# Patient Record
Sex: Male | Born: 1985 | Race: Black or African American | Hispanic: No | Marital: Single | Smoking: Former smoker
Health system: Southern US, Community
[De-identification: ages and names within clinical notes are randomized; demographics above are authoritative.]

## PROBLEM LIST (undated history)

## (undated) DIAGNOSIS — F209 Schizophrenia, unspecified: Secondary | ICD-10-CM

## (undated) DIAGNOSIS — F329 Major depressive disorder, single episode, unspecified: Secondary | ICD-10-CM

## (undated) DIAGNOSIS — F32A Depression, unspecified: Secondary | ICD-10-CM

## (undated) DIAGNOSIS — T1491XA Suicide attempt, initial encounter: Secondary | ICD-10-CM

---

## 2015-11-29 ENCOUNTER — Emergency Department (HOSPITAL_COMMUNITY)
Admission: EM | Admit: 2015-11-29 | Discharge: 2015-11-29 | Disposition: A | Discharge status: Home / Self Care | Payer: Medicaid Other | Attending: Emergency Medicine | Admitting: Emergency Medicine

## 2015-11-29 ENCOUNTER — Encounter (HOSPITAL_COMMUNITY): Payer: Self-pay | Admitting: Emergency Medicine

## 2015-11-29 DIAGNOSIS — E162 Hypoglycemia, unspecified: Secondary | ICD-10-CM | POA: Diagnosis not present

## 2015-11-29 HISTORY — DX: Suicide attempt, initial encounter: T14.91XA

## 2015-11-29 LAB — URINALYSIS, ROUTINE W REFLEX MICROSCOPIC
Bilirubin Urine: NEGATIVE
Glucose, UA: NEGATIVE mg/dL
Hgb urine dipstick: NEGATIVE
KETONES UR: NEGATIVE mg/dL
NITRITE: NEGATIVE
PH: 6.5 (ref 5.0–8.0)
PROTEIN: NEGATIVE mg/dL
Specific Gravity, Urine: 1.005 (ref 1.005–1.030)

## 2015-11-29 LAB — BASIC METABOLIC PANEL
ANION GAP: 6 (ref 5–15)
BUN: 8 mg/dL (ref 6–20)
CHLORIDE: 105 mmol/L (ref 101–111)
CO2: 31 mmol/L (ref 22–32)
Calcium: 9.6 mg/dL (ref 8.9–10.3)
Creatinine, Ser: 1.1 mg/dL (ref 0.61–1.24)
Glucose, Bld: 119 mg/dL — ABNORMAL HIGH (ref 65–99)
POTASSIUM: 3.7 mmol/L (ref 3.5–5.1)
SODIUM: 142 mmol/L (ref 135–145)

## 2015-11-29 LAB — CBC
HCT: 43.5 % (ref 39.0–52.0)
Hemoglobin: 14.2 g/dL (ref 13.0–17.0)
MCH: 28.1 pg (ref 26.0–34.0)
MCHC: 32.6 g/dL (ref 30.0–36.0)
MCV: 86 fL (ref 78.0–100.0)
Platelets: 208 10*3/uL (ref 150–400)
RBC: 5.06 MIL/uL (ref 4.22–5.81)
RDW: 13.8 % (ref 11.5–15.5)
WBC: 3.7 10*3/uL — ABNORMAL LOW (ref 4.0–10.5)

## 2015-11-29 LAB — URINE MICROSCOPIC-ADD ON: SQUAMOUS EPITHELIAL / LPF: NONE SEEN

## 2015-11-29 LAB — CBG MONITORING, ED
Glucose-Capillary: 80 mg/dL (ref 65–99)
Glucose-Capillary: 143 mg/dL — ABNORMAL HIGH (ref 65–99)

## 2015-11-29 NOTE — ED Notes (Signed)
Discharge instructions and follow up care reviewed with patient. Patient verbalized understanding. 

## 2015-11-29 NOTE — ED Notes (Signed)
Patient ambulated around nurses station without difficulty. 

## 2015-11-29 NOTE — ED Notes (Signed)
Patient is aware urine sample is needed. Urinal placed at bedside.

## 2015-11-29 NOTE — ED Notes (Signed)
PA at bedside.

## 2015-11-29 NOTE — Discharge Instructions (Signed)
Do not skip meals. Eat a well balanced diet and stay well hydrated. Schedule a follow up with a PCP.    Hypoglycemia Hypoglycemia occurs when the glucose in your blood is too low. Glucose is a type of sugar that is your body's main energy source. Hormones, such as insulin and glucagon, control the level of glucose in the blood. Insulin lowers blood glucose and glucagon increases blood glucose. Having too much insulin in your blood stream, or not eating enough food containing sugar, can result in hypoglycemia. Hypoglycemia can happen to people with or without diabetes. It can develop quickly and can be a medical emergency.  CAUSES   Missing or delaying meals.  Not eating enough carbohydrates at meals.  Taking too much diabetes medicine.  Not timing your oral diabetes medicine or insulin doses with meals, snacks, and exercise.  Nausea and vomiting.  Certain medicines.  Severe illnesses, such as hepatitis, kidney disorders, and certain eating disorders.  Increased activity or exercise without eating something extra or adjusting medicines.  Drinking too much alcohol.  A nerve disorder that affects body functions like your heart rate, blood pressure, and digestion (autonomic neuropathy).  A condition where the stomach muscles do not function properly (gastroparesis). Therefore, medicines and food may not absorb properly.  Rarely, a tumor of the pancreas can produce too much insulin. SYMPTOMS   Hunger.  Sweating (diaphoresis).  Change in body temperature.  Shakiness.  Headache.  Anxiety.  Lightheadedness.  Irritability.  Difficulty concentrating.  Dry mouth.  Tingling or numbness in the hands or feet.  Restless sleep or sleep disturbances.  Altered speech and coordination.  Change in mental status.  Seizures or prolonged convulsions.  Combativeness.  Drowsiness (lethargic).  Weakness.  Increased heart rate or palpitations.  Confusion.  Pale, gray skin  color.  Blurred or double vision.  Fainting. DIAGNOSIS  A physical exam and medical history will be performed. Your caregiver may make a diagnosis based on your symptoms. Blood tests and other lab tests may be performed to confirm a diagnosis. Once the diagnosis is made, your caregiver will see if your signs and symptoms go away once your blood glucose is raised.  TREATMENT  Usually, you can easily treat your hypoglycemia when you notice symptoms.  Check your blood glucose. If it is less than 70 mg/dl, take one of the following:   3-4 glucose tablets.    cup juice.    cup regular soda.   1 cup skim milk.   -1 tube of glucose gel.   5-6 hard candies.   Avoid high-fat drinks or food that may delay a rise in blood glucose levels.  Do not take more than the recommended amount of sugary foods, drinks, gel, or tablets. Doing so will cause your blood glucose to go too high.   Wait 10-15 minutes and recheck your blood glucose. If it is still less than 70 mg/dl or below your target range, repeat treatment.   Eat a snack if it is more than 1 hour until your next meal.  There may be a time when your blood glucose may go so low that you are unable to treat yourself at home when you start to notice symptoms. You may need someone to help you. You may even faint or be unable to swallow. If you cannot treat yourself, someone will need to bring you to the hospital.  HOME CARE INSTRUCTIONS  If you have diabetes, follow your diabetes management plan by:  Taking your medicines  as directed.  Following your exercise plan.  Following your meal plan. Do not skip meals. Eat on time.  Testing your blood glucose regularly. Check your blood glucose before and after exercise. If you exercise longer or different than usual, be sure to check blood glucose more frequently.  Wearing your medical alert jewelry that says you have diabetes.  Identify the cause of your hypoglycemia. Then,  develop ways to prevent the recurrence of hypoglycemia.  Do not take a hot bath or shower right after an insulin shot.  Always carry treatment with you. Glucose tablets are the easiest to carry.  If you are going to drink alcohol, drink it only with meals.  Tell friends or family members ways to keep you safe during a seizure. This may include removing hard or sharp objects from the area or turning you on your side.  Maintain a healthy weight. SEEK MEDICAL CARE IF:   You are having problems keeping your blood glucose in your target range.  You are having frequent episodes of hypoglycemia.  You feel you might be having side effects from your medicines.  You are not sure why your blood glucose is dropping so low.  You notice a change in vision or a new problem with your vision. SEEK IMMEDIATE MEDICAL CARE IF:   Confusion develops.  A change in mental status occurs.  The inability to swallow develops.  Fainting occurs.   This information is not intended to replace advice given to you by your health care provider. Make sure you discuss any questions you have with your health care provider.   Document Released: 06/08/2005 Document Revised: 06/13/2013 Document Reviewed: 02/12/2015 Elsevier Interactive Patient Education [employer]2016 [employer] Inc[employer]

## 2015-11-29 NOTE — ED Notes (Signed)
Bed: WA11 Expected date:  Expected time:  Means of arrival:  Comments: EMS- Hypoglycemia 

## 2015-11-29 NOTE — ED Notes (Addendum)
Per EMS, patient got weak this morning and fell to floor. CBG was 66 upon EMS arrival. Reports not eating since breakfast yesterday. Patient ate 2 muffins and blood sugar got up to 86. No Hx of DM. [facility].

## 2015-11-29 NOTE — ED Provider Notes (Signed)
CSN: [id number]     Arrival date & time 11/29/15  0730 History   First MD Initiated Contact with Patient 11/29/15 0745     Chief Complaint  Patient presents with  . Hypoglycemia  . Weakness   HPI  Mr. Brennen is a 30 year old male with past medical history depression and recent suicide attempt presenting with hypoglycemia and generalized weakness. Patient states he has not eaten in the past 24 hours due to no appetite. This morning upon waking, he walked into the kitchen to get some breakfast and passed out. He denies prodromal symptoms of headache, vision changes, lightheadedness, dizziness, chest pain, shortness of breath, nausea, vomiting or abdominal pain. He is unsure how long he was unconscious for but his sister states she arrived in the room two minutes later and found him. She was able to wake him up with verbal stimuli. She fed him 3 biscuits and 2 muffins. EMS was called and blood glucose was found to be 66 upon arrival. On recheck at arrival to emergency department, blood glucose was 86. Patient is unsure if he hit his head when he fell. He states he still feels mildly lightheaded but otherwise has no complaints. He denies a history of diabetes but states he has passed out from hypoglycemia in the past. He has no other complaints today.  Past Medical History  Diagnosis Date  . Suicide attempt [facility])    History reviewed. No pertinent past surgical history. No family history on file. Social History  Substance Use Topics  . Smoking status: Never Smoker   . Smokeless tobacco: None  . Alcohol Use: No    Review of Systems  All other systems reviewed and are negative.     Allergies  Review of patient's allergies indicates no known allergies.  Home Medications   Prior to Admission medications   Medication Sig Start Date End Date Taking? Authorizing Provider  FLUoxetine (PROZAC) 20 MG capsule Take 20 mg by mouth daily.   Yes Historical Provider, MD  risperidone (RISPERDAL) 4 MG  tablet Take 4 mg by mouth at bedtime.   Yes Historical Provider, MD  temazepam (RESTORIL) 30 MG capsule Take 30 mg by mouth at bedtime.   Yes Historical Provider, MD   BP 120/80 mmHg  Pulse 60  Temp(Src) 97.4 F (36.3 C) (Oral)  Resp 16  Ht 6' (1.829 m)  Wt 58.968 kg  BMI 17.63 kg/m2  SpO2 100% Physical Exam  Constitutional: He appears well-developed and well-nourished. No distress.  Flat affect  HENT:  Head: Normocephalic and atraumatic.  Mouth/Throat: Oropharynx is clear and moist. No oropharyngeal exudate.  Eyes: Conjunctivae and EOM are normal. Pupils are equal, round, and reactive to light. Right eye exhibits no discharge. Left eye exhibits no discharge.  Neck: Normal range of motion. Neck supple. No rigidity.  No cervical spine tenderness to palpation. No bony deformities of the C-spine. Full range motion of the neck intact.  Cardiovascular: Normal rate, regular rhythm and normal heart sounds.   Pulmonary/Chest: Effort normal and breath sounds normal. No respiratory distress.  Abdominal: Soft. There is no tenderness. There is no rebound and no guarding.  Musculoskeletal: Normal range of motion.  Neurological: He is alert. No cranial nerve deficit. He exhibits normal muscle tone. Coordination normal.  Cranial nerves 3-12 tested and intact. 5/5 strength of all major muscle groups. Sensation to light touch intact throughout. Finger to nose coordinated.   Skin: Skin is warm and dry.  Psychiatric: He has a normal mood  and affect. His behavior is normal.  Nursing note and vitals reviewed.   ED Course  Procedures (including critical care time) Labs Review Labs Reviewed  BASIC METABOLIC PANEL - Abnormal; Notable for the following:    Glucose, Bld 119 (*)    All other components within normal limits  CBC - Abnormal; Notable for the following:    WBC 3.7 (*)    All other components within normal limits  URINALYSIS, ROUTINE W REFLEX MICROSCOPIC (NOT AT [facility]) - Abnormal; Notable  for the following:    Leukocytes, UA MODERATE (*)    All other components within normal limits  URINE MICROSCOPIC-ADD ON - Abnormal; Notable for the following:    Bacteria, UA MANY (*)    All other components within normal limits  CBG MONITORING, ED - Abnormal; Notable for the following:    Glucose-Capillary 143 (*)    All other components within normal limits  CBG MONITORING, ED    Imaging Review No results found. I have personally reviewed and evaluated these images and lab results as part of my medical decision-making.   EKG Interpretation   Date/Time:  Friday November 29 2015 07:44:42 EDT Ventricular Rate:  70 PR Interval:  160 QRS Duration: 98 QT Interval:  405 QTC Calculation: 437 R Axis:   113 Text Interpretation:  Sinus rhythm Left atrial enlargement Probable right  ventricular hypertrophy Borderline ST elevation, anterior leads Baseline  wander in lead(s) V2 Confirmed by DELO  MD, DOUGLAS ([id number]) on 11/29/2015  7:59:55 AM      MDM   Final diagnoses:  Hypoglycemia   30 year old male presenting with hypoglycemia and lightheadedness. Hemodynamically stable. Patient is nontoxic-appearing. Nonfocal neuro exam. Heart regular rate and rhythm. No cervical spine tenderness to palpation. Blood work is reassuring. Urinalysis with bacteria and leukocytes. Patient is a systematic so we will send for culture before treatment. Blood glucose continues to rise while being monitored in the emergency room. Patient is taking water and eating peanut butter. Blood glucose before discharge is 143. Patient ambulates in the emergency department without recurrent lightheadedness or syncope. Patient reports a poor appetite and had not eaten for 24 hours prior to this episode. Patient has history of similar episodes from not eating. Discussed importance of a balanced diet. Patient does not have a PCP; given referral information for community health and wellness. Return precautions given in discharge  paperwork and discussed with pt at bedside. Pt stable for discharge    Alveta HeimlichStevi Conny Situ, PA-C 11/29/15 1254  Geoffery Lyonsouglas Delo, MD 11/29/15 1318

## 2015-12-01 LAB — URINE CULTURE

## 2015-12-02 ENCOUNTER — Telehealth (HOSPITAL_BASED_OUTPATIENT_CLINIC_OR_DEPARTMENT_OTHER): Payer: Self-pay | Admitting: Emergency Medicine

## 2015-12-02 NOTE — Telephone Encounter (Signed)
Post ED Visit - Positive Culture Follow-up: Successful Patient Follow-Up  Culture assessed and recommendations reviewed by: [x]  Enzo BiNathan Batchelder, Pharm.D. []  [name], [address]Ph[address]D., BCPS []  [name], Pharm.D. []  [name], [address]Ph[address] BCPS []  BlackhawkMinh Pham, [place]arm[place]D., BCPS, AAHIVP []  [name], Pharm.D., BCPS, AAHIVP []  [name], Pharm.D. []  [name], [address]Ph[address]  Positive urine culture Klebsiella  []  Patient discharged without antimicrobial prescription and treatment is now indicated []  Organism is resistant to prescribed ED discharge antimicrobial []  Patient with positive blood cultures  Changes discussed with ED provider:V. Pickering PA New antibiotic prescription if symptomatic, start Batrim DS 1 tablet bid x 7 days  Attempting to contact patient for symptom check   Berle MullMiller, Benjimen Kelley 12/02/2015, 10:59 AM

## 2015-12-02 NOTE — Progress Notes (Signed)
ED Antimicrobial Stewardship Positive Culture Follow Up   David Novak is an 30 y.o. male who [facility] with a chief complaint of  Chief Complaint  Patient presents with  . Hypoglycemia  . Weakness    Recent Results (from the past 720 hour(s))  Urine culture     Status: Abnormal   Collection Time: 11/29/15 10:17 AM  Result Value Ref Range Status   Specimen Description URINE, RANDOM  Final   Special Requests NONE  Final   Culture >=100,000 COLONIES/mL KLEBSIELLA PNEUMONIAE (A)  Final   Report Status 12/01/2015 FINAL  Final   Organism ID, Bacteria KLEBSIELLA PNEUMONIAE (A)  Final      Susceptibility   Klebsiella pneumoniae - MIC*    AMPICILLIN 16 RESISTANT Resistant     CEFAZOLIN <=4 SENSITIVE Sensitive     CEFTRIAXONE <=1 SENSITIVE Sensitive     CIPROFLOXACIN <=0.25 SENSITIVE Sensitive     GENTAMICIN <=1 SENSITIVE Sensitive     IMIPENEM <=0.25 SENSITIVE Sensitive     NITROFURANTOIN 32 SENSITIVE Sensitive     TRIMETH/SULFA <=20 SENSITIVE Sensitive     AMPICILLIN/SULBACTAM <=2 SENSITIVE Sensitive     PIP/TAZO <=4 SENSITIVE Sensitive     * >=100,000 COLONIES/mL KLEBSIELLA PNEUMONIAE    Patient reports not UTI symptoms but flow manager will call patient and ask if having any dysuria, frequency, or fevers.   If patient now symptomatic, will treat with Bactrim 1 DS tablet BID x 7 days  ED Provider: Teressa LowerVrinda Pickering NP   David Novak,David Novak J 12/02/2015, 9:04 AM Infectious Diseases Pharmacist Phone# [phone]

## 2015-12-11 ENCOUNTER — Telehealth (HOSPITAL_BASED_OUTPATIENT_CLINIC_OR_DEPARTMENT_OTHER): Payer: Self-pay | Admitting: *Deleted

## 2015-12-11 NOTE — Telephone Encounter (Signed)
Post ED Visit - Positive Culture Follow-up  Culture report reviewed by antimicrobial stewardship pharmacist:  [x]  David Novak, Pharm.D. []  [name], Pharm.D., BCPS []  [name], Pharm.D. []  [name], Pharm.D., BCPS []  [name], [address]Pharm[address]D., BCPS, AAHIVP []  [name], Pharm.D., BCPS, AAHIVP []  [name], Pharm.D. []  [name], [address]Pharm[address]D.  Positive urine culture No further patient follow-up is required at this time per [facility].  Contacted in response to letter and pt [facility]  [name], David Novak [facility] 12/11/2015, 1:41 PM

## 2016-01-23 ENCOUNTER — Encounter (HOSPITAL_COMMUNITY): Payer: Self-pay | Admitting: Emergency Medicine

## 2016-01-23 ENCOUNTER — Emergency Department (HOSPITAL_COMMUNITY)
Admission: EM | Admit: 2016-01-23 | Discharge: 2016-01-24 | Payer: Medicaid Other | Attending: Emergency Medicine | Admitting: Emergency Medicine

## 2016-01-23 ENCOUNTER — Emergency Department (HOSPITAL_COMMUNITY): Payer: Medicaid Other

## 2016-01-23 DIAGNOSIS — F312 Bipolar disorder, current episode manic severe with psychotic features: Secondary | ICD-10-CM | POA: Diagnosis present

## 2016-01-23 DIAGNOSIS — R45851 Suicidal ideations: Secondary | ICD-10-CM

## 2016-01-23 DIAGNOSIS — F29 Unspecified psychosis not due to a substance or known physiological condition: Secondary | ICD-10-CM | POA: Insufficient documentation

## 2016-01-23 DIAGNOSIS — Z5181 Encounter for therapeutic drug level monitoring: Secondary | ICD-10-CM | POA: Insufficient documentation

## 2016-01-23 HISTORY — DX: Depression, unspecified: F32.A

## 2016-01-23 HISTORY — DX: Major depressive disorder, single episode, unspecified: F32.9

## 2016-01-23 LAB — RAPID URINE DRUG SCREEN, HOSP PERFORMED
Amphetamines: NOT DETECTED
Barbiturates: NOT DETECTED
Benzodiazepines: NOT DETECTED
COCAINE: NOT DETECTED
OPIATES: NOT DETECTED
Tetrahydrocannabinol: NOT DETECTED

## 2016-01-23 LAB — COMPREHENSIVE METABOLIC PANEL
ALBUMIN: 4.9 g/dL (ref 3.5–5.0)
ALT: 14 U/L — ABNORMAL LOW (ref 17–63)
AST: 19 U/L (ref 15–41)
Alkaline Phosphatase: 48 U/L (ref 38–126)
Anion gap: 9 (ref 5–15)
BUN: 7 mg/dL (ref 6–20)
CHLORIDE: 104 mmol/L (ref 101–111)
CO2: 24 mmol/L (ref 22–32)
Calcium: 9.5 mg/dL (ref 8.9–10.3)
Creatinine, Ser: 1 mg/dL (ref 0.61–1.24)
GFR calc Af Amer: >60 mL/min (ref >60)
Glucose, Bld: 108 mg/dL — ABNORMAL HIGH (ref 65–99)
POTASSIUM: 3.4 mmol/L — AB (ref 3.5–5.1)
Sodium: 137 mmol/L (ref 135–145)
Total Bilirubin: 0.8 mg/dL (ref 0.3–1.2)
Total Protein: 7.8 g/dL (ref 6.5–8.1)

## 2016-01-23 LAB — ETHANOL

## 2016-01-23 LAB — CBC
HCT: 42.5 % (ref 39.0–52.0)
Hemoglobin: 14.9 g/dL (ref 13.0–17.0)
MCH: 28 pg (ref 26.0–34.0)
MCHC: 35.1 g/dL (ref 30.0–36.0)
MCV: 79.7 fL (ref 78.0–100.0)
PLATELETS: 261 10*3/uL (ref 150–400)
RBC: 5.33 MIL/uL (ref 4.22–5.81)
RDW: 13.7 % (ref 11.5–15.5)
WBC: 6.5 10*3/uL (ref 4.0–10.5)

## 2016-01-23 LAB — ACETAMINOPHEN LEVEL

## 2016-01-23 LAB — SALICYLATE LEVEL: Salicylate Lvl: <4 mg/dL (ref 2.8–30.0)

## 2016-01-23 MED ORDER — SODIUM CHLORIDE 0.9 % IV SOLN: INTRAVENOUS | Status: DC

## 2016-01-23 MED ORDER — SODIUM CHLORIDE 0.9 % IV BOLUS (SEPSIS): 1000.0000 mL | Freq: Once | INTRAVENOUS | Status: DC

## 2016-01-23 MED ORDER — SODIUM CHLORIDE 0.9 % IV BOLUS (SEPSIS)
1000.0000 mL | Freq: Once | INTRAVENOUS | Status: DC
Start: 2016-01-23 — End: 2016-01-23

## 2016-01-23 NOTE — BH Assessment (Addendum)
Assessment Note  David Novak is an 30 y.o. male. He presents to [facility] with GPD. Patient lives with mother and siblings. He was reportedly pacing and religiously preoccupied. Patient's mother called 911/GPD for help. Patient voluntarily came to [facility] with GPD. Patient is very religious. He sts, "I was told 2 weeks that I would die.Marland KitchenMarland KitchenMarland KitchenGod told me". Patient sts that since then he has been unable to function worried about his death. Patient sts that he often hears voices of the "God and the Devil". Sts that the voices of God tell him good things and voices of the Devil tell him to do bad things. Sts that the devils voices has told him to steal. He has acted on the hallucinations and stole things. He reports hearing voices for the past 5 yrs. He explains that after he became involved with the church and "Got saved" the voices immediately started. Denies visual hallucinations.   Patient denies suicidal thoughts. He has a history of a suicide attempt in which he jumped off a 2 story building. Patient does not identify a specific trigger but does state that he was severely depressed. He was also hospitalized at [facility] for this suicide attempt. Today patient sts that he is able to contract for safety. No self mutilating behaviors. He describes his current depressive symptoms as loss of interest in usual pleasures, hopelessness, fatigue, and crying spells.   No homicidal thoughts. Patient is calm and cooperative during the assessment. No legal issues reported. No family history of mental health illness. No history of abuse.   Diagnosis: Major Depressive Disorder, Recurrent, Severe, with psychotic features  Past Medical History:  Past Medical History:  Diagnosis Date  . Depression   . Suicide attempt [facility])     History reviewed. No pertinent surgical history.  Family History: No family history on file.  Social History:  reports that he has never smoked. He has never used smokeless tobacco. He reports that he does  not drink alcohol. His drug history is not on file.  Additional Social History:  Alcohol / Drug Use Pain Medications: SEE MAR Prescriptions: SEE MAR Over the Counter: SEE MAR History of alcohol / drug use?: No history of alcohol / drug abuse  CIWA: CIWA-Ar BP: 156/92 Pulse Rate: 75 COWS:    Allergies: No Known Allergies  Home Medications:  (Not in a hospital admission)  OB/GYN Status:  No LMP for male patient.  General Assessment Data [facility] of [facility]: [facility] TTS Assessment: In system Is this a Tele or Face-to-Face Assessment?: Face-to-Face Is this an Initial Assessment or a Re-assessment for this encounter?: Initial Assessment Marital status: Single Maiden name:  (n/a) Is patient pregnant?: Yes Pregnancy Status: No Living Arrangements: Other (Comment), Parent (mother, 2 sisters, and nephew) Can pt return to current living arrangement?: No Admission Status: Voluntary Is patient capable of signing voluntary admission?: Yes Referral Source: Other Insurance type:  (Self Pay )     Crisis Care Plan Living Arrangements: Other (Comment), Parent (mother, 2 sisters, and nephew) Legal Guardian: Other: (No legal guardian ) Name of Psychiatrist:  (no psychiatrist ) Name of Therapist:  (no therapist)  Education Status Is patient currently in school?: No Current Grade:  (n/a) Highest grade of school patient has completed:  (HS (reads @ a 5th grade level)) Name of school:  (n/a) Contact person:  (n/a)  Risk to self with the past 6 months Suicidal Ideation: No Has patient been a risk to self within the past 6 months prior to  admission? : No Suicidal Intent: No Has patient had any suicidal intent within the past 6 months prior to admission? : No Is patient at risk for suicide?: No Suicidal Plan?: No Has patient had any suicidal plan within the past 6 months prior to admission? : No Access to Means: No What has been your use of drugs/alcohol within the last 12 months?:   (patient denies ) Previous Attempts/Gestures: Yes How many times?:  (1x; May 2017; jumped from a 2 story building ) Other Self Harm Risks:  (n/a) Triggers for Past Attempts: Other (Comment) (depression) Intentional Self Injurious Behavior: None Family Suicide History: No Recent stressful life event(s): Other (Comment) ("God told me 2 weeks ago I was going to die") Persecutory voices/beliefs?: Yes Depression: Yes Depression Symptoms: Feeling angry/irritable, Loss of interest in usual pleasures, Feeling worthless/self pity, Guilt, Fatigue, Isolating, Insomnia, Tearfulness, Despondent Substance abuse history and/or treatment for substance abuse?: No Suicide prevention information given to non-admitted patients: Not applicable  Risk to Others within the past 6 months Homicidal Ideation: No Does patient have any lifetime risk of violence toward others beyond the six months prior to admission? : No Thoughts of Harm to Others: No Current Homicidal Intent: No Current Homicidal Plan: No Access to Homicidal Means: No Identified Victim:  (n/a) History of harm to others?: No Assessment of Violence: None Noted Violent Behavior Description:  (patient is calm and cooperative ) Does patient have access to weapons?: No Criminal Charges Pending?: No Does patient have a court date: No Is patient on probation?: Unknown  Psychosis Hallucinations: Auditory ("I hear the voice of Jesus and the Devil") Delusions: None noted  Mental Status Report Appearance/Hygiene: In scrubs Eye Contact: Good Motor Activity: Freedom of movement Speech: Logical/coherent Level of Consciousness: Alert Mood: Depressed Affect: Depressed Anxiety Level: None Thought Processes: Relevant Judgement: Impaired Orientation: Person, Place, Time, Situation Obsessive Compulsive Thoughts/Behaviors: None  Cognitive Functioning Concentration: Decreased Memory: Remote Intact, Recent Intact IQ: Average Insight: Fair Impulse  Control: Poor Appetite: Fair Weight Loss:  (none reported) Weight Gain:  (none reported) Sleep: Decreased Total Hours of Sleep:  (2-4 hrs per night ) Vegetative Symptoms: None  ADLScreening [facility]) Patient's cognitive ability adequate to safely complete daily activities?: Yes Patient able to express need for assistance with ADLs?: Yes Independently performs ADLs?: Yes (appropriate for developmental age)  Prior Inpatient Therapy Prior Inpatient Therapy: Yes Prior Therapy Dates:  (May 2017) Prior Therapy Facilty/Provider(s):  ([facility]-May 2017) Reason for Treatment:  (suicide attempt; depression; psychosis )  Prior Outpatient Therapy Prior Outpatient Therapy: No Prior Therapy Dates:  (n/a) Prior Therapy Facilty/Provider(s):  (n/a) Reason for Treatment:  (n/a) Does patient have an ACCT team?: No Does patient have Intensive In-House Services?  : No Does patient have [facility] services? : No Does patient have P4CC services?: No  ADL Screening (condition at time of admission) Patient's cognitive ability adequate to safely complete daily activities?: Yes Is the patient deaf or have difficulty hearing?: No Does the patient have difficulty seeing, even when wearing glasses/contacts?: No Does the patient have difficulty concentrating, remembering, or making decisions?: No Patient able to express need for assistance with ADLs?: Yes Does the patient have difficulty dressing or bathing?: No Independently performs ADLs?: Yes (appropriate for developmental age) Does the patient have difficulty walking or climbing stairs?: No Weakness of Legs: None Weakness of Arms/Hands: None  Home Assistive Devices/Equipment Home Assistive Devices/Equipment: None    Abuse/Neglect Assessment (Assessment to be complete while patient is alone) Physical Abuse: Denies  Verbal Abuse: Denies Sexual Abuse: Denies Exploitation of patient/patient's resources: Denies Self-Neglect: Denies Values  / Beliefs Cultural Requests During Hospitalization: None Spiritual Requests During Hospitalization: None Consults Spiritual Care Consult Needed: No Social Work Consult Needed: No Merchant navy officer (For Healthcare) Does patient have an advance directive?: No Would patient like information on creating an advanced directive?: No - patient declined information Nutrition Screen- [facility] Patient's home diet: Regular  Additional Information 1:1 In Past 12 Months?: No CIRT Risk: No Elopement Risk: No Does patient have medical clearance?: Yes     Disposition:  Disposition Initial Assessment Completed for this Encounter: Yes Disposition of Patient: Inpatient treatment program (Patient meets criteria for INPT treatment per Barry Brunner) Type of inpatient treatment program: Adult (Per Nanine Means, DNP, patient meets criteria for INPT treat)  On Site Evaluation by:   Reviewed with Physician:     Melynda Ripple [facility] 01/23/2016 6:55 PM

## 2016-01-23 NOTE — ED Notes (Signed)
David Novak has stayed in his room most of the day.  He did get up and walk around for a short time.  He was noted to be at the doorway of another patient and was told of the rule that patients are not allowed in each others room.  He stepped away immediately and has not made any further attempts to enter other patient rooms.

## 2016-01-23 NOTE — ED Notes (Signed)
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

## 2016-01-23 NOTE — Progress Notes (Signed)
   01/23/16 1600  Clinical Encounter Type  Visited With Patient  Visit Type Initial;Psychological support;Spiritual support;ED;Behavioral Health  Referral From Nurse  Consult/Referral To Chaplain  Spiritual Encounters  Spiritual Needs Emotional;Other (Comment) (Pastoral Conversation/Support)  Stress Factors  Patient Stress Factors Other (Comment) (Spiritual Ideations )    I visited with the patient per referral by the nurse who stated that the patient had been dealing with religious ideations/fixations. I was told that the patient had not been taking his medication for his mental condition.  The patient was open to visiting with me and talking about his struggles. The patient has been fixating on his religious view that "God is a wrathful God." The patient stated that God had came to him two days ago and had told him that he was going to die. He stated that God told him that he was not going to be able to go to [place].  The patient was open to discussing how his religious ideations might be something that is coming from his condition and not actually from God.  I offered the idea to the patient of God as a loving and gracious God in an attempt to divert his thoughts away from things that are not helpful in his healing process. The patient was receptive to thinking about his religious ideations in a different manner; but I am not sure exactly how much he was able to internalize.   Will continue to follow this patient.   Please contact Spiritual Care for further assistance.    Chaplain Clint Bolder M.Div.

## 2016-01-23 NOTE — ED Triage Notes (Signed)
Patient presents for medical clearance. Reports mother called police concerned because he was pacing back and forth. Patient denies SI/HI/VH. Patient states "Jesus Christ told me I'm going to die". Patient denies any other AH.

## 2016-01-23 NOTE — Progress Notes (Signed)
Per Nanine Means, DNP, patient meets criteria for psychiatric inpatient treatment on 01/23/16.  Referrals have been sent out to: [facility], [place] Disposition staff 01/23/2016 10:33 PM

## 2016-01-23 NOTE — ED Provider Notes (Signed)
[facility]-EMERGENCY DEPT Provider Note   CSN: [id number] Arrival date & time: 01/23/16  1258  First Provider Contact:  None       History   Chief Complaint Chief Complaint  Patient presents with  . Suicidal    HPI  David Novak is a 30 y.o. malewho presents emergency department for psychiatric evaluation. . The patient reports that he is a very religious person and that God speaks to him frequently.  He states that 2 days ago God told him he was going to die and he has been extremely sad and upset. His mother has been worried about him.  He states he has tried to kill himself before by jumping out of a window and that is why he has scars all over his arms and neck.  He states that it was because he was "hanging on to God."  He has a past medical history of suicide attempt. According to nursing note. The parent. The patient has been extremely agitated at home, pacing and not sleeping. He does admit to seeing physicians given to him by God. He denies homicidal ideation, alcohol or drug abuse.  HPI  Past Medical History:  Diagnosis Date  . Depression   . Suicide attempt (HCC)     There are no active problems to display for this patient.   History reviewed. No pertinent surgical history.     Home Medications    Prior to Admission medications   Not on File    Family History No family history on file.  Social History Social History  Substance Use Topics  . Smoking status: Never Smoker  . Smokeless tobacco: Never Used  . Alcohol use No     Allergies   Review of patient's allergies indicates no known allergies.   Review of Systems Review of Systems  Unable to perform ROS: Psychiatric disorder     Physical Exam Updated Vital Signs BP 142/100 (BP Location: Left Arm) Comment: Rn Latricia made aware of high blood pressure  Pulse 71   Temp 97.8 F (36.6 C) (Oral)   Resp 18   SpO2 100%   Physical Exam  Constitutional: He is oriented to person, place, and time. He  appears well-developed and well-nourished. No distress.  HENT:  Head: Normocephalic and atraumatic.  Eyes: Conjunctivae are normal. No scleral icterus.  Neck: Normal range of motion. Neck supple.  Cardiovascular: Normal rate, regular rhythm and normal heart sounds.   Pulmonary/Chest: Effort normal and breath sounds normal. No respiratory distress.  Abdominal: Soft. There is no tenderness.  Musculoskeletal: He exhibits no edema.  Neurological: He is alert and oriented to person, place, and time.  Skin: Skin is warm and dry. He is not diaphoretic.  Psychiatric:  The patient responds appropriately to questioning. He is delusional, hyperreligious. He is calm throughout visit.  Nursing note and vitals reviewed.    ED Treatments / Results  Labs (all labs ordered are listed, but only abnormal results are displayed) Labs Reviewed  COMPREHENSIVE METABOLIC PANEL - Abnormal; Notable for the following:       Result Value   Potassium 3.4 (*)    Glucose, Bld 108 (*)    ALT 14 (*)    All other components within normal limits  ACETAMINOPHEN LEVEL - Abnormal; Notable for the following:    Acetaminophen (Tylenol), Serum <10 (*)    All other components within normal limits  ETHANOL  SALICYLATE LEVEL  CBC  URINE RAPID DRUG SCREEN, HOSP PERFORMED  EKG  EKG Interpretation None       Radiology Dg Chest Port 1 View  Result Date: 01/23/2016 CLINICAL DATA:  Medical clearance. EXAM: PORTABLE CHEST 1 VIEW COMPARISON:  None. FINDINGS: The heart size and mediastinal contours are within normal limits. Both lungs are clear. The visualized skeletal structures are unremarkable. IMPRESSION: No active disease. Electronically Signed   By: Signa Kell M.D.   On: 01/23/2016 14:44    Procedures Procedures (including critical care time)  Medications Ordered in ED Medications - No data to display   Initial Impression / Assessment and Plan / ED Course  I have reviewed the triage vital signs and the  nursing notes.  Pertinent labs & imaging results that were available during my care of the patient were reviewed by me and considered in my medical decision making (see chart for details).  Clinical Course    Patient medically clear for psych eval.   Final Clinical Impressions(s) / ED Diagnoses   Final diagnoses:  Suicidal ideation  Psychosis, unspecified psychosis type    New Prescriptions New Prescriptions   No medications on file     Arthor Captain, PA-C 01/24/16 0759    Pricilla Loveless, MD 01/28/16 0006

## 2016-01-23 NOTE — ED Notes (Signed)
Patient calm on approach.  He was oriented to the unit and offered food and fluids.  He was given a snack and some ice water.  Patient states that God told him he was going to die.  States he is afraid of dying because God told him where he was going.  Patient believes he cannot go to heaven because he has been a "lukewarm [place]."  I talked with patient a bit about forgiveness and drawing closer to his faith, but he is fixated on not getting to heaven at this time.  He did ask to speak with a chaplain.  Chaplain services were called and will be sending someone to the unit.

## 2016-01-23 NOTE — ED Notes (Signed)
Pt awake, alert & responsive, no distress noted, cooperative, slightly anxious, guarded.  Monitoring for safety, Q 15 min checks in effect.

## 2016-01-23 NOTE — ED Notes (Signed)
Patient and belongings wanded by security.  

## 2016-01-24 ENCOUNTER — Inpatient Hospital Stay (HOSPITAL_COMMUNITY)
Admission: AD | Admit: 2016-01-24 | Discharge: 2016-01-31 | DRG: 885 | Disposition: A | Discharge status: Home / Self Care | Payer: Medicaid Other | Source: Intra-hospital | Attending: Psychiatry | Admitting: Psychiatry

## 2016-01-24 ENCOUNTER — Encounter (HOSPITAL_COMMUNITY): Payer: Self-pay

## 2016-01-24 DIAGNOSIS — G4700 Insomnia, unspecified: Secondary | ICD-10-CM | POA: Diagnosis present

## 2016-01-24 DIAGNOSIS — F250 Schizoaffective disorder, bipolar type: Secondary | ICD-10-CM | POA: Diagnosis not present

## 2016-01-24 DIAGNOSIS — F312 Bipolar disorder, current episode manic severe with psychotic features: Secondary | ICD-10-CM

## 2016-01-24 DIAGNOSIS — I10 Essential (primary) hypertension: Secondary | ICD-10-CM | POA: Diagnosis present

## 2016-01-24 DIAGNOSIS — R45851 Suicidal ideations: Secondary | ICD-10-CM | POA: Diagnosis not present

## 2016-01-24 MED ORDER — LITHIUM CARBONATE ER 450 MG PO TBCR
450.0000 mg | EXTENDED_RELEASE_TABLET | Freq: Two times a day (BID) | ORAL | Status: DC
  Administered 2016-01-24 – 2016-01-31 (×13): 450 mg via ORAL
  Filled 2016-01-24 (×15): qty 1
  Filled 2016-01-24: qty 14
  Filled 2016-01-24 (×2): qty 1
  Filled 2016-01-24: qty 14

## 2016-01-24 MED ORDER — ALUM & MAG HYDROXIDE-SIMETH 200-200-20 MG/5ML PO SUSP: 30.0000 mL | ORAL | Status: DC | PRN

## 2016-01-24 MED ORDER — TRAZODONE HCL 100 MG PO TABS
100.0000 mg | ORAL_TABLET | Freq: Every day | ORAL | Status: DC
  Administered 2016-01-25 – 2016-01-30 (×6): 100 mg via ORAL
  Filled 2016-01-24 (×8): qty 1
  Filled 2016-01-24: qty 7
  Filled 2016-01-24: qty 1

## 2016-01-24 MED ORDER — MAGNESIUM HYDROXIDE 400 MG/5ML PO SUSP: 30.0000 mL | Freq: Every day | ORAL | Status: DC | PRN

## 2016-01-24 MED ORDER — BENZTROPINE MESYLATE 0.5 MG PO TABS
0.5000 mg | ORAL_TABLET | Freq: Two times a day (BID) | ORAL | Status: DC
  Administered 2016-01-25 – 2016-01-31 (×12): 0.5 mg via ORAL
  Filled 2016-01-24: qty 14
  Filled 2016-01-24 (×9): qty 1
  Filled 2016-01-24: qty 14
  Filled 2016-01-24 (×8): qty 1

## 2016-01-24 MED ORDER — BENZTROPINE MESYLATE 1 MG PO TABS
0.5000 mg | ORAL_TABLET | Freq: Two times a day (BID) | ORAL | Status: DC
  Administered 2016-01-24: 0.5 mg via ORAL
  Filled 2016-01-24: qty 1

## 2016-01-24 MED ORDER — HALOPERIDOL 1 MG PO TABS
1.0000 mg | ORAL_TABLET | Freq: Two times a day (BID) | ORAL | Status: DC
  Administered 2016-01-24: 1 mg via ORAL
  Filled 2016-01-24: qty 1

## 2016-01-24 MED ORDER — HALOPERIDOL 1 MG PO TABS
1.0000 mg | ORAL_TABLET | Freq: Two times a day (BID) | ORAL | Status: DC
  Administered 2016-01-25 – 2016-01-27 (×4): 1 mg via ORAL
  Filled 2016-01-24 (×5): qty 1
  Filled 2016-01-24: qty 2
  Filled 2016-01-24 (×5): qty 1

## 2016-01-24 MED ORDER — LITHIUM CARBONATE ER 450 MG PO TBCR
450.0000 mg | EXTENDED_RELEASE_TABLET | Freq: Two times a day (BID) | ORAL | Status: DC
  Administered 2016-01-24: 450 mg via ORAL
  Filled 2016-01-24 (×2): qty 1

## 2016-01-24 MED ORDER — ACETAMINOPHEN 325 MG PO TABS: 650.0000 mg | ORAL_TABLET | Freq: Four times a day (QID) | ORAL | Status: DC | PRN

## 2016-01-24 MED ORDER — TRAZODONE HCL 100 MG PO TABS
100.0000 mg | ORAL_TABLET | Freq: Every day | ORAL | Status: DC
Start: 2016-01-24 — End: 2016-01-24

## 2016-01-24 NOTE — BH Assessment (Signed)
[facility] Assessment Progress Note  Per Thedore Mins, MD, this pt requires psychiatric hospitalization at this time.  Lillia Abed, RN, [facility] has assigned pt to [facility] Rm 502-2; they will be ready to receive pt at 17:30.  Pt has signed Voluntary Admission and Consent for Treatment, as well as Consent to Release Information to his mother, and signed forms have been faxed to [facility].  Pt's nurse, Kendal Hymen, has been notified, and agrees to send original paperwork along with pt via Juel Burrow, and to call report to [phone].  Doylene Canning, MA Triage Specialist [phone]

## 2016-01-24 NOTE — Progress Notes (Addendum)
D Pt. Denies SI and HI, no complaints of pain or discomfort noted at present time.  A Writer offered support and encouragement,  Attempted to talk with pt. But pt. Appeared very guarded and withdrawn.  R Pt. Paced up and down the hall this pm.  Pt. Refused his lithium.  Writer took it out of the package thinking he had decided to take it and he stared at the pill then handed it back stating he did not need it.  Pt. Remains safe on the unit.   Reported that pt. Took his 20:00 dose of lithium at 5am. Pt. Has paced all night until appr. 645am.

## 2016-01-24 NOTE — ED Notes (Signed)
Pt discharged ambulatory with [place] driver.  All belongings were sent with patient. 

## 2016-01-24 NOTE — Progress Notes (Signed)
[facility] Group Notes:  (Nursing/MHT/Case Management/Adjunct)  Date:  01/24/2016  Time:  8:51 PM  Type of Therapy:  Psychoeducational Skills  Participation Level:  Active  Participation Quality:  Attentive  Affect:  Blunted  Cognitive:  Lacking  Insight:  Lacking  Engagement in Group:  Limited  Modes of Intervention:  Education  Summary of Progress/Problems: The patient would only share with the group that he was grateful to be here in the hospital.   Hazle Coca S 01/24/2016, 8:51 PM

## 2016-01-24 NOTE — Consult Note (Signed)
[facility] Face-to-Face Psychiatry Consult   Reason for Consult:  Psychosis  Referring Physician:  EDP Patient Identification: Ediel Unangst MRN:  [id number] Principal Diagnosis: Bipolar affective disorder, current episode manic with psychotic symptoms ([place]) Diagnosis:   Patient Active Problem List   Diagnosis Date Noted  . Bipolar affective disorder, current episode manic with psychotic symptoms ([place]) [F31.2] 01/24/2016    Priority: High    Total Time spent with patient: 45 minutes  Subjective:   Nafis Mccleary is a 30 y.o. male patient admitted with psychosis.  HPI:  On admission:  30 y.o. male. He presents to [facility] with GPD. Patient lives with mother and siblings. He was reportedly pacing and religiously preoccupied. Patient's mother called 911/GPD for help. Patient voluntarily came to [facility] with GPD. Patient is very religious. He sts, "I was told 2 weeks that I would die.Marland KitchenMarland KitchenMarland KitchenGod told me". Patient sts that since then he has been unable to function worried about his death. Patient sts that he often hears voices of the "God and the Devil". Sts that the voices of God tell him good things and voices of the Devil tell him to do bad things. Sts that the devils voices has told him to steal. He has acted on the hallucinations and stole things. He reports hearing voices for the past 5 yrs. He explains that after he became involved with the church and "Got saved" the voices immediately started. Denies visual hallucinations.   Patient denies suicidal thoughts. He has a history of a suicide attempt in which he jumped off a 2 story building. Patient does not identify a specific trigger but does state that he was severely depressed. He was also hospitalized at [facility] for this suicide attempt. Today patient sts that he is able to contract for safety. No self mutilating behaviors. He describes his current depressive symptoms as loss of interest in usual pleasures, hopelessness, fatigue, and crying spells.   No homicidal  thoughts. Patient is calm and cooperative during the assessment. No legal issues reported. No family history of mental health illness. No history of abuse.   Today, patient remains hyperreligious and hearing the voice of God telling him he will die, denies command hallucinations.  No homicidal ideations or alcohol/drug abuse.  Responding to internal stimuli, admission needed.  Past Psychiatric History:  Depression   Risk to Self: Suicidal Ideation: No Suicidal Intent: No Is patient at risk for suicide?: No Suicidal Plan?: No Access to Means: No What has been your use of drugs/alcohol within the last 12 months?:  (patient denies ) How many times?:  (1x; May 2017; jumped from a 2 story building ) Other Self Harm Risks:  (n/a) Triggers for Past Attempts: Other (Comment) (depression) Intentional Self Injurious Behavior: None Risk to Others: Homicidal Ideation: No Thoughts of Harm to Others: No Current Homicidal Intent: No Current Homicidal Plan: No Access to Homicidal Means: No Identified Victim:  (n/a) History of harm to others?: No Assessment of Violence: None Noted Violent Behavior Description:  (patient is calm and cooperative ) Does patient have access to weapons?: No Criminal Charges Pending?: No Does patient have a court date: No Prior Inpatient Therapy: Prior Inpatient Therapy: Yes Prior Therapy Dates:  (May 2017) Prior Therapy Facilty/Provider(s):  ([facility]-May 2017) Reason for Treatment:  (suicide attempt; depression; psychosis ) Prior Outpatient Therapy: Prior Outpatient Therapy: No Prior Therapy Dates:  (n/a) Prior Therapy Facilty/Provider(s):  (n/a) Reason for Treatment:  (n/a) Does patient have an ACCT team?: No Does patient have Intensive In-House Services?  :  No Does patient have [facility] services? : No Does patient have P4CC services?: No  Past Medical History:  Past Medical History:  Diagnosis Date  . Depression   . Suicide attempt [facility])    History reviewed. No  pertinent surgical history. Family History: No family history on file. Family Psychiatric  History: none Social History:  History  Alcohol Use No     History  Drug use: Unknown    Social History   Social History  . Marital status: Single    Spouse name: N/A  . Number of children: N/A  . Years of education: N/A   Social History Main Topics  . Smoking status: Never Smoker  . Smokeless tobacco: Never Used  . Alcohol use No  . Drug use: Unknown  . Sexual activity: Not Asked   Other Topics Concern  . None   Social History Narrative  . None   Additional Social History:    Allergies:  No Known Allergies  Labs:  Results for orders placed or performed during the hospital encounter of 01/23/16 (from the past 48 hour(s))  Comprehensive metabolic panel     Status: Abnormal   Collection Time: 01/23/16  1:39 PM  Result Value Ref Range   Sodium 137 135 - 145 mmol/L   Potassium 3.4 (L) 3.5 - 5.1 mmol/L   Chloride 104 101 - 111 mmol/L   CO2 24 22 - 32 mmol/L   Glucose, Bld 108 (H) 65 - 99 mg/dL   BUN 7 6 - 20 mg/dL   Creatinine, Ser 1.00 0.61 - 1.24 mg/dL   Calcium 9.5 8.9 - 10.3 mg/dL   Total Protein 7.8 6.5 - 8.1 g/dL   Albumin 4.9 3.5 - 5.0 g/dL   AST 19 15 - 41 U/L   ALT 14 (L) 17 - 63 U/L   Alkaline Phosphatase 48 38 - 126 U/L   Total Bilirubin 0.8 0.3 - 1.2 mg/dL   GFR calc non Af Amer >60 >60 mL/min   GFR calc Af Amer >60 >60 mL/min    Comment: (NOTE) The eGFR has been calculated using the CKD EPI equation. This calculation has not been validated in all clinical situations. eGFR's persistently <60 mL/min signify possible Chronic Kidney Disease.    Anion gap 9 5 - 15  Ethanol     Status: None   Collection Time: 01/23/16  1:39 PM  Result Value Ref Range   Alcohol, Ethyl (B) <5 <5 mg/dL    Comment:        LOWEST DETECTABLE LIMIT FOR SERUM ALCOHOL IS 5 mg/dL FOR MEDICAL PURPOSES ONLY   Salicylate level     Status: None   Collection Time: 01/23/16  1:39 PM   Result Value Ref Range   Salicylate Lvl <8.0 2.8 - 30.0 mg/dL  Acetaminophen level     Status: Abnormal   Collection Time: 01/23/16  1:39 PM  Result Value Ref Range   Acetaminophen (Tylenol), Serum <10 (L) 10 - 30 ug/mL    Comment:        THERAPEUTIC CONCENTRATIONS VARY SIGNIFICANTLY. A RANGE OF 10-30 ug/mL MAY BE AN EFFECTIVE CONCENTRATION FOR MANY PATIENTS. HOWEVER, SOME ARE BEST TREATED AT CONCENTRATIONS OUTSIDE THIS RANGE. ACETAMINOPHEN CONCENTRATIONS >150 ug/mL AT 4 HOURS AFTER INGESTION AND >50 ug/mL AT 12 HOURS AFTER INGESTION ARE OFTEN ASSOCIATED WITH TOXIC REACTIONS.   cbc     Status: None   Collection Time: 01/23/16  1:39 PM  Result Value Ref Range   WBC 6.5 4.0 -  10.5 K/uL   RBC 5.33 4.22 - 5.81 MIL/uL   Hemoglobin 14.9 13.0 - 17.0 g/dL   HCT 42.5 39.0 - 52.0 %   MCV 79.7 78.0 - 100.0 fL   MCH 28.0 26.0 - 34.0 pg   MCHC 35.1 30.0 - 36.0 g/dL   RDW 13.7 11.5 - 15.5 %   Platelets 261 150 - 400 K/uL  Rapid urine drug screen (hospital performed)     Status: None   Collection Time: 01/23/16  3:31 PM  Result Value Ref Range   Opiates NONE DETECTED NONE DETECTED   Cocaine NONE DETECTED NONE DETECTED   Benzodiazepines NONE DETECTED NONE DETECTED   Amphetamines NONE DETECTED NONE DETECTED   Tetrahydrocannabinol NONE DETECTED NONE DETECTED   Barbiturates NONE DETECTED NONE DETECTED    Comment:        DRUG SCREEN FOR MEDICAL PURPOSES ONLY.  IF CONFIRMATION IS NEEDED FOR ANY PURPOSE, NOTIFY LAB WITHIN 5 DAYS.        LOWEST DETECTABLE LIMITS FOR URINE DRUG SCREEN Drug Class       Cutoff (ng/mL) Amphetamine      1000 Barbiturate      200 Benzodiazepine   858 Tricyclics       850 Opiates          300 Cocaine          300 THC              50     No current facility-administered medications for this encounter.    No current outpatient prescriptions on file.    Musculoskeletal: Strength & Muscle Tone: within normal limits Gait & Station: normal Patient  leans: N/A  Psychiatric Specialty Exam: Physical Exam  Constitutional: He is oriented to person, place, and time. He appears well-developed and well-nourished.  HENT:  Head: Normocephalic.  Neck: Normal range of motion.  Respiratory: Effort normal.  Musculoskeletal: Normal range of motion.  Neurological: He is alert and oriented to person, place, and time.  Skin: Skin is warm and dry.  Psychiatric: His behavior is normal. His mood appears anxious. His affect is labile. His speech is rapid and/or pressured. Thought content is delusional. Cognition and memory are normal. He expresses impulsivity and inappropriate judgment. He expresses suicidal ideation.    Review of Systems  Constitutional: Negative.   HENT: Negative.   Eyes: Negative.   Respiratory: Negative.   Cardiovascular: Negative.   Gastrointestinal: Negative.   Genitourinary: Negative.   Musculoskeletal: Negative.   Skin: Negative.   Neurological: Negative.   Endo/Heme/Allergies: Negative.   Psychiatric/Behavioral: Positive for depression and suicidal ideas. The patient is nervous/anxious and has insomnia.     Blood pressure 142/100, pulse 71, temperature 97.8 F (36.6 C), temperature source Oral, resp. rate 18, SpO2 100 %.There is no height or weight on file to calculate BMI.  General Appearance: Casual  Eye Contact:  Fair  Speech:  Normal Rate  Volume:  Normal  Mood:  Depressed  Affect:  Flat  Thought Process:  Descriptions of Associations: Tangential  Orientation:  Full (Time, Place, and Person)  Thought Content:  Delusions and Hallucinations: Auditory  Suicidal Thoughts:  Yes.  without intent/plan  Homicidal Thoughts:  No  Memory:  Immediate;   Fair Recent;   Fair Remote;   Fair  Judgement:  Fair  Insight:  Lacking  Psychomotor Activity:  Normal  Concentration:  Concentration: Fair and Attention Span: Fair  Recall:  [employer] of Knowledge:  Fair  Language:  Good  Akathisia:  No  Handed:  Right  AIMS  (if indicated):     Assets:  Housing Leisure Time Physical Health Resilience Social Support  ADL's:  Intact  Cognition:  Impaired,  Mild  Sleep:        Treatment Plan Summary: Daily contact with patient to assess and evaluate symptoms and progress in treatment, Medication management and Plan bipolar affective disorder, mania, severe with psychotic features:  -Crisis stabilization -Medication management:  Lithium ER 450 mg BID for mood and suicidal ideations, Haldol 1 mg BID for psychosis, Cogentin 0.5 mg BID for EPS, and Trazodone 100 mg at bedtime for sleep. -Individual counseling  Disposition: Recommend psychiatric Inpatient admission when medically cleared.  Waylan Boga, NP 01/24/2016 8:45 AM  Patient seen face-to-face for psychiatric evaluation, chart reviewed and case discussed with the physician extender and developed treatment plan. Reviewed the information documented and agree with the treatment plan. Corena Pilgrim, MD

## 2016-01-24 NOTE — ED Notes (Signed)
This patient is calm and cooperative.  He continues to believe God is talking to him and that he is "going to die " in the next couple of days.  He denies S/I and H/I.  15 minute checks and video monitoring continue.

## 2016-01-25 LAB — POTASSIUM: Potassium: 4.3 mmol/L (ref 3.5–5.1)

## 2016-01-25 MED ORDER — CLONIDINE HCL 0.1 MG PO TABS: 0.1000 mg | ORAL_TABLET | Freq: Once | ORAL | Status: DC

## 2016-01-25 MED ORDER — LORAZEPAM 1 MG PO TABS
2.0000 mg | ORAL_TABLET | Freq: Once | ORAL | Status: AC
  Administered 2016-01-25: 2 mg via ORAL
  Filled 2016-01-25: qty 2

## 2016-01-25 MED ORDER — POTASSIUM CHLORIDE CRYS ER 20 MEQ PO TBCR
20.0000 meq | EXTENDED_RELEASE_TABLET | Freq: Two times a day (BID) | ORAL | Status: DC
Start: 2016-01-25 — End: 2016-01-26
  Administered 2016-01-25 – 2016-01-26 (×2): 20 meq via ORAL
  Filled 2016-01-25 (×4): qty 1

## 2016-01-25 MED ORDER — CLONIDINE HCL 0.1 MG PO TABS
ORAL_TABLET | ORAL | Status: AC
  Administered 2016-01-25: 17:00:00
  Filled 2016-01-25: qty 1

## 2016-01-25 MED ORDER — AMLODIPINE BESYLATE 5 MG PO TABS
5.0000 mg | ORAL_TABLET | Freq: Every day | ORAL | Status: DC
  Administered 2016-01-25 – 2016-01-31 (×7): 5 mg via ORAL
  Filled 2016-01-25 (×3): qty 1
  Filled 2016-01-25: qty 7
  Filled 2016-01-25 (×6): qty 1
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
## 2016-01-25 NOTE — BHH Group Notes (Signed)
BHH Group Notes:  (Nursing/MHT/Case Management/Adjunct)  Date:  01/25/2016  Time:  11:59 AM  Type of Therapy:  Nurse Education  Participation Level:  NONE  Participation Quality:  DID NOT ATTEND  Affect:  DID NOT ATTEND  Cognitive:  DID NOT ATTEND  Insight: DID NOT ATTEND  Engagement in Group: DID NOT ATTEND  Modes of Intervention:  Discussion and Education  Summary of Progress/Problems: patient sleeping in room, did not attend  Vinetta Bergamo Shonette Rhames 01/25/2016, 11:59 AM

## 2016-01-25 NOTE — BHH Suicide Risk Assessment (Signed)
[facility] Admission Suicide Risk Assessment   Nursing information obtained from:  Patient Demographic factors:  Male, Low socioeconomic status, Unemployed Current Mental Status:  NA Loss Factors:  NA Historical Factors:  NA Risk Reduction Factors:  Living with another person, especially a relative  Total Time spent with patient: 1.5 hours Principal Problem: <principal problem not specified> Diagnosis:   Patient Active Problem List   Diagnosis Date Noted  . Bipolar affective disorder, current episode manic with psychotic symptoms ([facility]) [F31.2] 01/24/2016   Subjective Data: Somewhat sedate because of last night ativan. Endorses voices non commanding for now. Was pacing earlier and lithium was given. Poor insight and history of non compliance.  Continued Clinical Symptoms:  Alcohol Use Disorder Identification Test Final Score (AUDIT): 0 The "Alcohol Use Disorders Identification Test", Guidelines for Use in Primary Care, Second Edition.  World Science writer [facility]). Score between 0-7:  no or low risk or alcohol related problems. Score between 8-15:  moderate risk of alcohol related problems. Score between 16-19:  high risk of alcohol related problems. Score 20 or above:  warrants further diagnostic evaluation for alcohol dependence and treatment.   CLINICAL FACTORS:   Alcohol/Substance Abuse/Dependencies Schizophrenia:   Depressive state Less than 7 years old Paranoid or undifferentiated type More than one psychiatric diagnosis Unstable or Poor Therapeutic Relationship   Musculoskeletal: Strength & Muscle Tone: within normal limits Gait & Station: normal Patient leans: in bed for now  Psychiatric Specialty Exam: Physical Exam  Constitutional: No distress.  HENT:  Head: Normocephalic.    Review of Systems  Cardiovascular: Negative for chest pain.  Skin: Negative for rash.  Psychiatric/Behavioral: Positive for depression. The patient is nervous/anxious.     Blood  pressure (!) 142/100, pulse 91, temperature 98.5 F (36.9 C), temperature source Oral, resp. rate 16, height 6' (1.829 m), weight 66.2 kg (146 lb), SpO2 100 %.Body mass index is 19.8 kg/m.  General Appearance: Casual  Eye Contact:  Fair  Speech:  Slow  Volume:  Decreased  Mood:  Dysphoric  Affect:  Constricted  Thought Process:  Goal Directed  Orientation:  Full (Time, Place, and Person)  Thought Content:  Rumination  Suicidal Thoughts:  No  Homicidal Thoughts:  No  Memory:  Immediate;   Fair Recent;   Fair  Judgement:  Poor  Insight:  Shallow  Psychomotor Activity:  Decreased  Concentration:  Concentration: Fair and Attention Span: Fair  Recall:  [employer] of Knowledge:  Poor  Language:  Poor  Akathisia:  No  Handed:  Right  AIMS (if indicated):     Assets:  Desire for Improvement  ADL's:  Intact  Cognition:  WNL  Sleep:  Number of Hours: 0      COGNITIVE FEATURES THAT CONTRIBUTE TO RISK:  Closed-mindedness and Thought constriction (tunnel vision)    SUICIDE RISK:   Moderate:  Frequent suicidal ideation with limited intensity, and duration, some specificity in terms of plans, no associated intent, good self-control, limited dysphoria/symptomatology, some risk factors present, and identifiable protective factors, including available and accessible social support.   PLAN OF CARE: Admit for stabilization. Medication management and compliance .  I certify that inpatient services furnished can reasonably be expected to improve the patient's condition.  Thresa Ross, MD 01/25/2016, 9:51 AM

## 2016-01-25 NOTE — Progress Notes (Signed)
B[facility] Group Notes:  (Nursing/MHT/Case Management/Adjunct)  Date:  01/25/2016  Time:  9:23 PM  Type of Therapy:  Psychoeducational Skills  Participation Level:  Minimal  Participation Quality:  Resistant  Affect:  Blunted  Cognitive:  Lacking  Insight:  Lacking  Engagement in Group:  Resistant  Modes of Intervention:  Education  Summary of Progress/Problems: The patient shared with the group that he was able to communicate with his peers today and that he "saw everybody". In terms of the theme for the day, his coping skill will be to "be more openhearted".   David Novak S 01/25/2016, 9:23 PM

## 2016-01-25 NOTE — Tx Team (Signed)
Initial Interdisciplinary Treatment Plan   PATIENT STRESSORS: Medication change or noncompliance   PATIENT STRENGTHS: Communication skills Supportive family/friends   PROBLEM LIST: Problem List/Patient Goals Date to be addressed Date deferred Reason deferred Estimated date of resolution  Pt. Is psychotic (God is telling him he is going to die)      "I'm going to die"      "God to know everybody"                                           DISCHARGE CRITERIA:  Improved stabilization in mood, thinking, and/or behavior Motivation to continue treatment in a less acute level of care  PRELIMINARY DISCHARGE PLAN: Participate in family therapy Return to previous living arrangement  PATIENT/FAMIILY INVOLVEMENT: This treatment plan has been presented to and reviewed with the patient, David Novak, and/or family member,.  The patient and family have been given the opportunity to ask questions and make suggestions.  Cooper Render 01/25/2016, 12:38 AM

## 2016-01-25 NOTE — BHH Group Notes (Signed)
BHH Group Notes:  (Clinical Social Work)  01/25/2016  11:15-12:00PM  Summary of Progress/Problems:   Today's process group involved patients discussing their feelings related to being hospitalized, as well as how they can use their present feelings to create a goal for the day. The patient was late to group and did not say anything but appeared to listen.  Type of Therapy:  Group Therapy - Process  Participation Level:  Minimal  Participation Quality:  Attentive  Affect:  Blunted  Cognitive:  Disorganized  Insight:  Poor  Engagement in Therapy:  Poor  Modes of Intervention:  Exploration, Discussion  Ambrose Mantle, LCSW 01/25/2016, 4:33 PM

## 2016-01-25 NOTE — Plan of Care (Signed)
Problem: Health Behavior/Discharge Planning: Goal: Compliance with prescribed medication regimen will improve Outcome: Progressing Pt has been compliant with scheduled medications tonight.    

## 2016-01-25 NOTE — Progress Notes (Signed)
D: Pt was in the day room upon initial approach.  Pt has depressed affect and mood.  His goal is "trying to meet everybody."  Pt denies SI/HI, denies hallucinations, denies pain.  Pt has been visible in milieu.  He forwards little information to Probation officer.  Pt attended evening group.   A: Introduced self to pt.  Met with pt and offered support and encouragement.  Actively listened to pt.  Medications administered per order. R: Pt is compliant with medications.  Pt verbally contracts for safety.  Will continue to monitor and assess.

## 2016-01-25 NOTE — H&P (Signed)
Psychiatric Admission Assessment Adult  Patient Identification: David Novak MRN:  [id number] Date of Evaluation:  01/25/2016 Chief Complaint:  BIPOLAR AFECTIVE DISORDER,CURRENT EPISODE MANIC WITH PSYCHOTIC SYMPTOMS Principal Diagnosis: <principal problem not specified> Diagnosis:   Patient Active Problem List   Diagnosis Date Noted  . Bipolar affective disorder, current episode manic with psychotic symptoms (HCC) [F31.2] 01/24/2016   History of Present Illness:Per Consult Note-30 y.o.male. He presents to [facility] with GPD. Patient lives with mother and siblings. He was reportedly pacing and religiously preoccupied. Patient's mother called 911/GPD for help. Patient voluntarily came to [facility] with [facility]. Patient is very religious. He sts, "I was told 2 weeks that I would die.Marland KitchenMarland KitchenMarland KitchenGod told me". Patient sts that since then he has been unable to function worried about his death. Patient sts that he often hears voices of the "God and the Devil". Sts that the voices of God tell him good things and voices of the Devil tell him to do bad things. Sts that the devils voices has told him to steal. He has acted on the hallucinations and stole things. He reports hearing voices for the past 5 yrs. He explains that after he became involved with the church and "Got saved" the voices immediately started. Denies visual hallucinations.  Patient denies suicidal thoughts. He has a history of a suicide attempt in which he jumped off a 2 story building. Patient does not identify a specific trigger but does state that he was severely depressed. He was also hospitalized at [facility] for this suicide attempt. Today patient sts that he is able to contract for safety. No self mutilating behaviors. He describes his current depressive symptoms as loss of interest in usual pleasures, hopelessness, fatigue, and crying spells.  On evaluation: patient seen resting in bed. Patient validates that information provided above. Patient states he just needs to  rest. Patient reports the voices has quited some. Patient denies that the voices are command in nature. Encouragement, Reassurance and Support was provided.  Associated Signs/Symptoms: Depression Symptoms:  depressed mood, difficulty concentrating, (Hypo) Manic Symptoms:  Distractibility, Labiality of Mood, Anxiety Symptoms:  Excessive Worry, Psychotic Symptoms:  Hallucinations: Auditory Paranoia, PTSD Symptoms: Avoidance:  Decreased Interest/Participation Total Time spent with patient: 30 minutes  Past Psychiatric History: See Above  Is the patient at risk to self? Yes.    Has the patient been a risk to self in the past 6 months? Yes.    Has the patient been a risk to self within the distant past? Yes.    Is the patient a risk to others? No.  Has the patient been a risk to others in the past 6 months? No.  Has the patient been a risk to others within the distant past? No.   Prior Inpatient Therapy:   Prior Outpatient Therapy:    Alcohol Screening: 1. How often do you have a drink containing alcohol?: Never 9. Have you or someone else been injured as a result of your drinking?: No 10. Has a relative or friend or a doctor or another health worker been concerned about your drinking or suggested you cut down?: No Alcohol Use Disorder Identification Test Final Score (AUDIT): 0 Brief Intervention: AUDIT score less than 7 or less-screening does not suggest unhealthy drinking-brief intervention not indicated Substance Abuse History in the last 12 months:  No. Consequences of Substance Abuse: NA Previous Psychotropic Medications: Yes  Psychological Evaluations: Yes  Past Medical History:  Past Medical History:  Diagnosis Date  . Depression   .  Suicide attempt [facility])    History reviewed. No pertinent surgical history. Family History: History reviewed. No pertinent family history. Family Psychiatric  History: Unknown Tobacco Screening: Have you used any form of tobacco in the last 30  days? (Cigarettes, Smokeless Tobacco, Cigars, and/or Pipes): No Social History:  History  Alcohol Use No     History  Drug use: Unknown    Additional Social History: Marital status: Single Does patient have children?: No    Pain Medications: SEE MAR Prescriptions: SEE MAR Over the Counter: SEE MAR History of alcohol / drug use?: No history of alcohol / drug abuse                    Allergies:  No Known Allergies Lab Results:  Results for orders placed or performed during the hospital encounter of 01/23/16 (from the past 48 hour(s))  Rapid urine drug screen (hospital performed)     Status: None   Collection Time: 01/23/16  3:31 PM  Result Value Ref Range   Opiates NONE DETECTED NONE DETECTED   Cocaine NONE DETECTED NONE DETECTED   Benzodiazepines NONE DETECTED NONE DETECTED   Amphetamines NONE DETECTED NONE DETECTED   Tetrahydrocannabinol NONE DETECTED NONE DETECTED   Barbiturates NONE DETECTED NONE DETECTED    Comment:        DRUG SCREEN FOR MEDICAL PURPOSES ONLY.  IF CONFIRMATION IS NEEDED FOR ANY PURPOSE, NOTIFY LAB WITHIN 5 DAYS.        LOWEST DETECTABLE LIMITS FOR URINE DRUG SCREEN Drug Class       Cutoff (ng/mL) Amphetamine      1000 Barbiturate      200 Benzodiazepine   200 Tricyclics       300 Opiates          300 Cocaine          300 THC              50     Blood Alcohol level:  Lab Results  Component Value Date   ETH <5 01/23/2016    Metabolic Disorder Labs:  No results found for: HGBA1C, MPG No results found for: PROLACTIN No results found for: CHOL, TRIG, HDL, CHOLHDL, VLDL, LDLCALC  Current Medications: Current Facility-Administered Medications  Medication Dose Route Frequency Provider Last Rate Last Dose  . acetaminophen (TYLENOL) tablet 650 mg  650 mg Oral Q6H PRN Charm Rings, NP      . alum & mag hydroxide-simeth (MAALOX/MYLANTA) 200-200-20 MG/5ML suspension 30 mL  30 mL Oral Q4H PRN Charm Rings, NP      . benztropine  (COGENTIN) tablet 0.5 mg  0.5 mg Oral BID Charm Rings, NP      . haloperidol (HALDOL) tablet 1 mg  1 mg Oral BID Charm Rings, NP      . lithium carbonate (ESKALITH) CR tablet 450 mg  450 mg Oral Q12H Charm Rings, NP   450 mg at 01/24/16 2057  . magnesium hydroxide (MILK OF MAGNESIA) suspension 30 mL  30 mL Oral Daily PRN Charm Rings, NP      . traZODone (DESYREL) tablet 100 mg  100 mg Oral QHS Charm Rings, NP       PTA Medications: No prescriptions prior to admission.    Musculoskeletal: Strength & Muscle Tone: within normal limits Gait & Station: normal Patient leans: N/A  Psychiatric Specialty Exam: Physical Exam  Nursing note and vitals reviewed. Constitutional: He appears well-developed.  HENT:  Head: Normocephalic.  Psychiatric: He has a normal mood and affect. His behavior is normal.    Review of Systems  Psychiatric/Behavioral: Positive for hallucinations. The patient is nervous/anxious.     Blood pressure (!) 142/100, pulse 91, temperature 98.5 F (36.9 C), temperature source Oral, resp. rate 16, height 6' (1.829 m), weight 66.2 kg (146 lb), SpO2 100 %.Body mass index is 19.8 kg/m.  General Appearance: Casual and Guarded  Eye Contact:  Fair  Speech:  Garbled and Pressured  Volume:  Decreased  Mood:  Depressed and Irritable  Affect:  Congruent  Thought Process:  Coherent  Orientation:  Full (Time, Place, and Person)  Thought Content:  Hallucinations: Auditory  Suicidal Thoughts:  No  Homicidal Thoughts:  No  Memory:  Immediate;   Fair Recent;   Fair Remote;   Fair  Judgement:  Impaired  Insight:  Lacking  Psychomotor Activity:  Restlessness  Concentration:  Concentration: Poor  Recall:  Poor  Fund of Knowledge:  Fair  Language:  Fair  Akathisia:  No  Handed:  Right  AIMS (if indicated):     Assets:  Desire for Improvement Financial Resources/Insurance Intimacy Resilience Social Support  ADL's:  Intact  Cognition:  WNL  Sleep:  Number  of Hours: 0     I agree with current treatment plan on 01/25/2016, Patient seen face-to-face for psychiatric evaluation follow-up, chart reviewed and case discussed with the MD Gilmore Laroche. Reviewed the information documented and agree with the treatment plan.   Treatment Plan Summary: Daily contact with patient to assess and evaluate symptoms and progress in treatment and Medication management   Continue with  Lithium 450 mgs, Haldol 1 mg and cogentin 0.05mg  PO BID for mood stabilization. Continue with Trazodone  for insomnia Started Norvasc 5 mg PO QD or HTN Placed orders for re potassium level on 01/26/2016 [facility] collection  Will continue to monitor vitals ,medication compliance and treatment side effects while patient is here.  Reviewed labs Glucose 108 elevated, Potassium 3.4 3.4 BAL - 198, UDS- CSW will start working on disposition.  Patient to participate in therapeutic milieu  Observation Level/Precautions:  15 minute checks  Laboratory:  CBC Chemistry Profile UDS UA  Psychotherapy:  Individual and group session  Medications:  See above  Consultations:  Psychiatry  Discharge Concerns:  Safety, stabilization, and risk of access to medication and medication stabilization   Estimated LOS: 5-7days  Other:     I certify that inpatient services furnished can reasonably be expected to improve the patient's condition.    Oneta Rack, NP 8/5/20172:56 PM  I have examined the patient and agreed with the findings of H&P and treatment plan. I have also done suicide assessment on this patient.

## 2016-01-26 DIAGNOSIS — F312 Bipolar disorder, current episode manic severe with psychotic features: Secondary | ICD-10-CM

## 2016-01-26 NOTE — Progress Notes (Signed)
[facility] MD Progress Note  01/26/2016 11:59 AM David Novak  MRN:  [id number] Subjective:  Patient reports " I am feeling fine."  Objective:  David Novak is awake, alert and oriented X4. Seen resting in the dayroom. Denies suicidal or homicidal ideation. Patient appears flat and guarded.  Denies auditory or visual hallucination and does not appear to be responding to internal stimuli. Patient pacing the unit throughout the day. RN noted reports elevated pulse rates. Patient reports he is medication compliant without mediation side effects.Patient denies depression or depressive symptoms. Patient reports good appetite other wise and resting well. Patient Support, encouragement and reassurance was provided.   Principal Problem: Bipolar affective disorder, current episode manic with psychotic symptoms (HCC) Diagnosis:   Patient Active Problem List   Diagnosis Date Noted  . Bipolar affective disorder, current episode manic with psychotic symptoms (HCC) [F31.2] 01/24/2016   Total Time spent with patient: 30 minutes  Past Psychiatric History: See Above  Past Medical History:  Past Medical History:  Diagnosis Date  . Depression   . Suicide attempt [facility])    History reviewed. No pertinent surgical history. Family History: History reviewed. No pertinent family history. Family Psychiatric  History: See H&P Social History:  History  Alcohol Use No     History  Drug use: Unknown    Social History   Social History  . Marital status: Single    Spouse name: N/A  . Number of children: N/A  . Years of education: N/A   Social History Main Topics  . Smoking status: Never Smoker  . Smokeless tobacco: Never Used  . Alcohol use No  . Drug use: Unknown  . Sexual activity: Not Asked   Other Topics Concern  . None   Social History Narrative  . None   Additional Social History:    Pain Medications: SEE MAR Prescriptions: SEE MAR Over the Counter: SEE MAR History of alcohol / drug use?: No history  of alcohol / drug abuse                    Sleep: Good  Appetite:  Fair  Current Medications: Current Facility-Administered Medications  Medication Dose Route Frequency Provider Last Rate Last Dose  . acetaminophen (TYLENOL) tablet 650 mg  650 mg Oral Q6H PRN Charm Rings, NP      . alum & mag hydroxide-simeth (MAALOX/MYLANTA) 200-200-20 MG/5ML suspension 30 mL  30 mL Oral Q4H PRN Charm Rings, NP      . amLODipine (NORVASC) tablet 5 mg  5 mg Oral Daily Oneta Rack, NP   5 mg at 01/26/16 0803  . benztropine (COGENTIN) tablet 0.5 mg  0.5 mg Oral BID Charm Rings, NP   0.5 mg at 01/26/16 0803  . cloNIDine (CATAPRES) tablet 0.1 mg  0.1 mg Oral Once Oneta Rack, NP      . haloperidol (HALDOL) tablet 1 mg  1 mg Oral BID Charm Rings, NP   1 mg at 01/26/16 0803  . lithium carbonate (ESKALITH) CR tablet 450 mg  450 mg Oral Q12H Charm Rings, NP   450 mg at 01/26/16 0803  . magnesium hydroxide (MILK OF MAGNESIA) suspension 30 mL  30 mL Oral Daily PRN Charm Rings, NP      . traZODone (DESYREL) tablet 100 mg  100 mg Oral QHS Charm Rings, NP   100 mg at 01/25/16 2111    Lab Results:  Results for orders placed or  performed during the hospital encounter of 01/24/16 (from the past 48 hour(s))  Potassium     Status: None   Collection Time: 01/25/16  6:13 PM  Result Value Ref Range   Potassium 4.3 3.5 - 5.1 mmol/L    Comment[facility]    Blood Alcohol level:  Lab Results  Component Value Date   [facility] <5 01/23/2016    Metabolic Disorder Labs: No results found for: HGBA1C, MPG No results found for: PROLACTIN No results found for: CHOL, TRIG, HDL, CHOLHDL, VLDL, LDLCALC  Physical Findings: AIMS: Facial and Oral Movements Muscles of Facial Expression: None, normal Lips and Perioral Area: None, normal Jaw: None, normal Tongue: None, normal,Extremity Movements Upper (arms, wrists, hands, fingers): None, normal Lower (legs,  knees, ankles, toes): None, normal, Trunk Movements Neck, shoulders, hips: None, normal, Overall Severity Severity of abnormal movements (highest score from questions above): None, normal Incapacitation due to abnormal movements: None, normal Patient's awareness of abnormal movements (rate only patient's report): No Awareness, Dental Status Current problems with teeth and/or dentures?: No Does patient usually wear dentures?: No  CIWA:    COWS:     Musculoskeletal: Strength & Muscle Tone: within normal limits Gait & Station: normal Patient leans: N/A  Psychiatric Specialty Exam: Physical Exam  ROS  Blood pressure 127/76, pulse 85, temperature 97.9 F (36.6 C), temperature source Oral, resp. rate 16, height 6' (1.829 m), weight 66.2 kg (146 lb), SpO2 100 %.Body mass index is 19.8 kg/m.  General Appearance: Casual and Guarded  Eye Contact:  Fair  Speech:  Clear and Coherent  Volume:  Normal  Mood:  Anxious and Depressed  Affect:  Congruent  Thought Process:  Coherent  Orientation:  Full (Time, Place, and Person)  Thought Content:  Hallucinations: None  Suicidal Thoughts:  No  Homicidal Thoughts:  No  Memory:  Immediate;   Fair Recent;   Fair Remote;   Fair  Judgement:  Fair  Insight:  Fair  Psychomotor Activity:  Restlessness  Concentration:  Concentration: Fair  Recall:  FiservFair  Fund of Knowledge:  Fair  Language:  Good  Akathisia:  No  Handed:  Right  AIMS (if indicated):     Assets:  ArchitectCommunication Skills Financial Resources/Insurance Social Support  ADL's:  Intact  Cognition:  WNL  Sleep:  Number of Hours: 3.5     I agree with current treatment plan on 01/26/2016, Patient seen face-to-face for psychiatric evaluation follow-up, chart reviewed. Reviewed the information documented and agree with the treatment plan.  Treatment Plan Summary: Daily contact with patient to assess and evaluate symptoms and progress in treatment and Medication management   Continue with   Lithium 450 mgs, Haldol 1 mg and cogentin 0.05mg  PO BID for mood stabilization. Continue with Trazodone 50mg  for insomnia Contiune Norvasc 5 mg PO QD for HTN Placed orders for recheck- potassium level, prolactin, lipid panel, A1C, Lithium, TSH for [facility]27/2016 [facility]  Will continue to monitor vitals ,medication compliance and treatment side effects while patient is here.  Reviewed labs Glucose 108 elevated, Potassium 3.4, BAL - , UDS- CSW will start working on disposition.  Patient to participate in therapeutic milieu  Oneta Rackanika N Lewis, NP 01/26/2016, 11:59 AM  I agree with findings and treatment plan of this patient

## 2016-01-26 NOTE — Progress Notes (Signed)
DAR NOTE: Patient presents with anxious affect and depressed mood.  Denies pain, auditory and visual hallucinations.  Rates depression at 0, hopelessness at 0, and anxiety at 0.  Maintained on routine safety checks.  Medications given as prescribed.  Support and encouragement offered as needed. Patient observed socializing with peers in the dayroom.  Offered no complaint.

## 2016-01-26 NOTE — Progress Notes (Signed)
[facility] Group Notes:  (Nursing/MHT/Case Management/Adjunct)  Date:  01/26/2016  Time:  8:52 PM  Type of Therapy:  Psychoeducational Skills  Participation Level:  Minimal  Participation Quality:  Attentive  Affect:  Flat  Cognitive:  Appropriate  Insight:  Improving  Engagement in Group:  Improving  Modes of Intervention:  Education  Summary of Progress/Problems: The patient shared in group that he had a good day and that he was able to meet some new people on the hallway. No additional details were provided. As for the theme of the day, his support system will be comprised of his mother.   David Novak, David Novak 01/26/2016, 8:52 PM

## 2016-01-26 NOTE — Progress Notes (Signed)
D:  Pt was in the day room watching TV upon initial approach.  Pt has depressed affect and mood.  He reports his day is "going well" and his goal is "just to stay positive."  Pt denies SI/HI, denies hallucinations, denies pain.  Pt has been visible in milieu interacting with peers and staff appropriately.  Pt attended evening group.   A: Met with pt 1:1 and offered support and encouragement.  Actively listened to pt.  Medications administered per order.   R: Pt is compliant with medications.  Pt verbally contracts for safety.  Will continue to monitor and assess.

## 2016-01-26 NOTE — [facility] Group Notes (Signed)
[facility] Group Notes:  (Clinical Social Work)  01/26/2016  11:00AM-12:00PM  Summary of Progress/Problems:  The main focus of today's process group was to listen to a variety of genres of music and to identify that different types of music provoke different responses.  The patient then was able to identify personally what was soothing for them, as well as energizing, as well as how patient can personally use this knowledge in sleep habits, with depression, and with other symptoms.  The patient expressed at the beginning of group the overall feeling of "good" and said [place] contemporary music is his favorite to make himself feel better.  He was one of the more vocal people in group in responding to how various songs made them feel.  He had a brighter affect than yesterday.  Type of Therapy:  Music Therapy   Participation Level:  Active  Participation Quality:  Attentive   Affect:  Blunted  Cognitive:  Oriented  Insight:  Engaged  Engagement in Therapy:  Engaged  Modes of Intervention:   Activity, Exploration  Ambrose MantleMareida Grossman-Orr, LCSW 01/26/2016

## 2016-01-26 NOTE — Plan of Care (Signed)
Problem: Safety: Goal: Ability to remain free from injury will improve Outcome: Progressing Pt has not harmed self or others since admission.  He denies SI/HI and verbally contracts for safety.

## 2016-01-27 DIAGNOSIS — F250 Schizoaffective disorder, bipolar type: Secondary | ICD-10-CM | POA: Diagnosis present

## 2016-01-27 LAB — LIPID PANEL
CHOLESTEROL: 314 mg/dL — AB (ref 0–200)
HDL: 56 mg/dL (ref >40)
LDL CALC: 244 mg/dL — AB (ref 0–99)
TRIGLYCERIDES: 70 mg/dL (ref <150)
Total CHOL/HDL Ratio: 5.6 RATIO
VLDL: 14 mg/dL (ref 0–40)

## 2016-01-27 LAB — TSH: TSH: 0.368 u[IU]/mL (ref 0.350–4.500)

## 2016-01-27 LAB — LITHIUM LEVEL: Lithium Lvl: 0.35 mmol/L — ABNORMAL LOW (ref 0.60–1.20)

## 2016-01-27 MED ORDER — HALOPERIDOL 2 MG PO TABS
2.5000 mg | ORAL_TABLET | Freq: Two times a day (BID) | ORAL | Status: DC
  Filled 2016-01-27 (×2): qty 1

## 2016-01-27 MED ORDER — HALOPERIDOL 5 MG PO TABS
2.5000 mg | ORAL_TABLET | Freq: Two times a day (BID) | ORAL | Status: DC
  Administered 2016-01-27 – 2016-01-28 (×2): 2.5 mg via ORAL
  Filled 2016-01-27 (×6): qty 1

## 2016-01-27 MED ORDER — DIPHENHYDRAMINE HCL 50 MG/ML IJ SOLN: 25.0000 mg | Freq: Three times a day (TID) | INTRAMUSCULAR | Status: DC | PRN

## 2016-01-27 MED ORDER — HALOPERIDOL LACTATE 5 MG/ML IJ SOLN: 5.0000 mg | Freq: Three times a day (TID) | INTRAMUSCULAR | Status: DC | PRN

## 2016-01-27 MED ORDER — LORAZEPAM 2 MG/ML IJ SOLN: 1.0000 mg | Freq: Four times a day (QID) | INTRAMUSCULAR | Status: DC | PRN

## 2016-01-27 MED ORDER — HALOPERIDOL 5 MG PO TABS: 5.0000 mg | ORAL_TABLET | Freq: Three times a day (TID) | ORAL | Status: DC | PRN

## 2016-01-27 MED ORDER — LORAZEPAM 1 MG PO TABS: 1.0000 mg | ORAL_TABLET | Freq: Four times a day (QID) | ORAL | Status: DC | PRN

## 2016-01-27 MED ORDER — DIPHENHYDRAMINE HCL 25 MG PO CAPS
50.0000 mg | ORAL_CAPSULE | Freq: Three times a day (TID) | ORAL | Status: DC | PRN
Start: 2016-01-27 — End: 2016-01-31

## 2016-01-27 NOTE — Tx Team (Signed)
Interdisciplinary Treatment Plan Update (Adult)  Date:  01/27/2016   Time Reviewed:  8:19 AM   Progress in Treatment: Attending groups: Yes. Participating in groups:  Minimally Taking medication as prescribed:  Yes. Tolerating medication:  Yes. Family/Significant other contact made:  No Patient understands diagnosis:  Yes  As evidenced by seeking help with AH Discussing patient identified problems/goals with staff:  Yes, see initial care plan. Medical problems stabilized or resolved:  Yes. Denies suicidal/homicidal ideation: Yes. Issues/concerns per patient self-inventory:  No. Other:  New problem(s) identified:  Discharge Plan or Barriers:  Reason for Continuation of Hospitalization: Depression Hallucinations Medication stabilization  Comments:  David Novak is an 30 y.o. male. He presents to [facility] with GPD. Patient lives with mother and siblings. He was reportedly pacing and religiously preoccupied. Patient's mother called 911/GPD for help. Patient voluntarily came to [facility] with [facility]PD. Patient is very religious. He sts, "I was told 2 weeks that I would die.Marland KitchenMarland KitchenMarland KitchenGod told me". Patient sts that since then he has been unable to function worried about his death. Patient sts that he often hears voices of the "God and the Devil". Sts that the voices of God tell him good things and voices of the Devil tell him to do bad things. Sts that the devils voices has told him to steal. He has acted on the hallucinations and stole things. He reports hearing voices for the past 5 yrs Continue with Lithium 450 mg PO BID q 12 h for mood sx. Li level in 5 days . Will increase Haldol to 2.5 mg po bid for AH/mood sx. Will continue Cogentin 0.5 mg po bid for EPS. Continue with Trazodone 100 mg PO qhs  for insomnia  Estimated length of stay: 4-5 days  New goal(s):  Review of initial/current patient goals per problem list:   Review of initial/current patient goals per problem list:  1. Goal(s): Patient will  participate in aftercare plan   Met: Yes   Target date: 3-5 days post admission date   As evidenced by: Patient will participate within aftercare plan AEB aftercare provider and housing plan at discharge being identified. 01/27/16:  Return home, follow up outpt   2. Goal (s): Patient will exhibit decreased depressive symptoms and suicidal ideations.   Met:    Target date: 3-5 days post admission date   As evidenced by: Patient will utilize self rating of depression at 3 or below and demonstrate decreased signs of depression or be deemed stable for discharge by MD. 01/27/16:  Rates depression a 6 today     3. Goal(s): Patient will demonstrate decreased signs and symptoms of anxiety.   Met: No   Target date: 3-5 days post admission date   As evidenced by: Patient will utilize self rating of anxiety at 3 or below and demonstrated decreased signs of anxiety, or be deemed stable for discharge by MD 01/27/16:  Rates anxiety a 5 today  5. Goal(s): Patient will demonstrate decreased signs of psychosis  * Met: No  * Target date: 3-5 days post admission date  * As evidenced by: Patient will demonstrate decreased frequency of AVH or return to baseline function 01/27/16:  Pt endorsing AH         Attendees: Patient:  01/27/2016 8:19 AM   Family:   01/27/2016 8:19 AM   Physician:  Ursula Alert, MD 01/27/2016 8:19 AM   Nursing:   Hedy Jacob, RN 01/27/2016 8:19 AM   CSW:    Roque Lias, LCSW  01/27/2016 8:19 AM   Other:  01/27/2016 8:19 AM   Other:   01/27/2016 8:19 AM   Other:  Lars Pinks, Nurse CM 01/27/2016 8:19 AM   Other:   01/27/2016 8:19 AM   Other:  Norberto Sorenson, [place]  01/27/2016 8:19 AM   Other:  01/27/2016 8:19 AM   Other:  01/27/2016 8:19 AM   Other:  01/27/2016 8:19 AM   Other:  01/27/2016 8:19 AM   Other:  01/27/2016 8:19 AM   Other:   01/27/2016 8:19 AM    Scribe for Treatment Team:   Trish Mage, 01/27/2016 8:19 AM

## 2016-01-27 NOTE — [facility] Group Notes (Signed)
[facility] LCSW Group Therapy  01/27/2016 1:15 pm  Type of Therapy: Process Group Therapy  Participation Level:  Active  Participation Quality:  Appropriate  Affect:  Flat  Cognitive:  Oriented  Insight:  Improving  Engagement in Group:  Limited  Engagement in Therapy:  Limited  Modes of Intervention:  Activity, Clarification, Education, Problem-solving and Support  Summary of Progress/Problems: Today's group addressed the issue of overcoming obstacles.  Patients were asked to identify their biggest obstacle post d/c that stands in the way of their on-going success, and then problem solve as to how to manage this. Stayed the entire time, engaged throughout.  "I feel like life in general is an obstacle.  It's been going on for a couple of weeks now."  Identified patience as something he needs now as he wants to feel better immediately.  "I know from past experience that it takes awhile."  David Novak, David Novak 01/27/2016   3:29 PM

## 2016-01-27 NOTE — Progress Notes (Signed)
[facility] MD Progress Note  01/27/2016 3:19 PM David Novak  MRN:  [id number] Subjective:  Patient reports " I came to get help with my health issues. I am hearing voices .'   Objective:  David Novak is a 30 y.o.  AA male, who has a hx of schizoaffective do , who  presented to [facility] with GPD for being hyperactive , anxious and agitated and being psychotic.  Patient seen and chart reviewed.Discussed patient with treatment team. Patient today seen as anxious , continues to report AH- they are command - tells him " you are going to die .' Per staff pt continues to need encouragement and support - will continue treatment.      Principal Problem: Schizoaffective disorder, bipolar type (HCC) Diagnosis:   Patient Active Problem List   Diagnosis Date Noted  . Schizoaffective disorder, bipolar type (HCC) [F25.0] 01/27/2016   Total Time spent with patient: 25 minutes  Past Psychiatric History: See Above  Past Medical History:  Past Medical History:  Diagnosis Date  . Depression   . Suicide attempt [facility])    History reviewed. No pertinent surgical history. Family History: denies hx of HTN, Diabetes , thyroid disease in family. Family Psychiatric  History: See H&P Social History:  History  Alcohol Use No     History  Drug use: Unknown    Social History   Social History  . Marital status: Single    Spouse name: N/A  . Number of children: N/A  . Years of education: N/A   Social History Main Topics  . Smoking status: Never Smoker  . Smokeless tobacco: Never Used  . Alcohol use No  . Drug use: Unknown  . Sexual activity: Not Asked   Other Topics Concern  . None   Social History Narrative  . None   Additional Social History:    Pain Medications: SEE MAR Prescriptions: SEE MAR Over the Counter: SEE MAR History of alcohol / drug use?: No history of alcohol / drug abuse                    Sleep: Fair  Appetite:  Fair  Current Medications: Current Facility-Administered  Medications  Medication Dose Route Frequency Provider Last Rate Last Dose  . acetaminophen (TYLENOL) tablet 650 mg  650 mg Oral Q6H PRN Charm Rings, NP      . alum & mag hydroxide-simeth (MAALOX/MYLANTA) 200-200-20 MG/5ML suspension 30 mL  30 mL Oral Q4H PRN Charm Rings, NP      . amLODipine (NORVASC) tablet 5 mg  5 mg Oral Daily Oneta Rack, NP   5 mg at 01/27/16 1191  . benztropine (COGENTIN) tablet 0.5 mg  0.5 mg Oral BID Charm Rings, NP   0.5 mg at 01/27/16 4782  . cloNIDine (CATAPRES) tablet 0.1 mg  0.1 mg Oral Once Oneta Rack, NP      . haloperidol (HALDOL) tablet 1 mg  1 mg Oral BID Charm Rings, NP   1 mg at 01/27/16 9562  . lithium carbonate (ESKALITH) CR tablet 450 mg  450 mg Oral Q12H Charm Rings, NP   450 mg at 01/27/16 1308  . magnesium hydroxide (MILK OF MAGNESIA) suspension 30 mL  30 mL Oral Daily PRN Charm Rings, NP      . traZODone (DESYREL) tablet 100 mg  100 mg Oral QHS Charm Rings, NP   100 mg at 01/26/16 2110    Lab Results:  Results for orders placed or performed during the hospital encounter of 01/24/16 (from the past 48 hour(s))  Potassium     Status: None   Collection Time: 01/25/16  6:13 PM  Result Value Ref Range   Potassium 4.3 3.5 - 5.1 mmol/L    Comment: Performed at [facility]  Lithium level     Status: Abnormal   Collection Time: 01/27/16  6:30 AM  Result Value Ref Range   Lithium Lvl 0.35 (L) 0.60 - 1.20 mmol/L    Comment: Performed at [facility]  Lipid panel     Status: Abnormal   Collection Time: 01/27/16  6:30 AM  Result Value Ref Range   Cholesterol 314 (H) 0 - 200 mg/dL   Triglycerides 70 <161 mg/dL   HDL 56 >09 mg/dL   Total CHOL/HDL Ratio 5.6 RATIO   VLDL 14 0 - 40 mg/dL   LDL Cholesterol 604 (H) 0 - 99 mg/dL    Comment:        Total Cholesterol/HDL:CHD Risk Coronary Heart Disease Risk Table                     Men   Women  1/2 Average Risk   3.4   3.3  Average Risk        5.0   4.4  2 X Average Risk   9.6   7.1  3 X Average Risk  23.4   11.0        Use the calculated Patient Ratio above and the CHD Risk Table to determine the patient's CHD Risk.        ATP III CLASSIFICATION (LDL):  <100     mg/dL   Optimal  540-981  mg/dL   Near or Above                    Optimal  130-159  mg/dL   Borderline  191-478  mg/dL   High  >295     mg/dL   Very High Performed at [facility]   TSH     Status: None   Collection Time: 01/27/16  6:30 AM  Result Value Ref Range   TSH 0.368 0.350 - 4.500 uIU/mL    Comment: Performed at [facility]    Blood Alcohol level:  Lab Results  Component Value Date   [facility] <5 01/23/2016    Metabolic Disorder Labs: No results found for: HGBA1C, MPG No results found for: PROLACTIN Lab Results  Component Value Date   CHOL 314 (H) 01/27/2016   TRIG 70 01/27/2016   HDL 56 01/27/2016   CHOLHDL 5.6 01/27/2016   VLDL 14 01/27/2016   LDLCALC 244 (H) 01/27/2016    Physical Findings: AIMS: Facial and Oral Movements Muscles of Facial Expression: None, normal Lips and Perioral Area: None, normal Jaw: None, normal Tongue: None, normal,Extremity Movements Upper (arms, wrists, hands, fingers): None, normal Lower (legs, knees, ankles, toes): None, normal, Trunk Movements Neck, shoulders, hips: None, normal, Overall Severity Severity of abnormal movements (highest score from questions above): None, normal Incapacitation due to abnormal movements: None, normal Patient's awareness of abnormal movements (rate only patient's report): No Awareness, Dental Status Current problems with teeth and/or dentures?: No Does patient usually wear dentures?: No  CIWA:    COWS:     Musculoskeletal: Strength & Muscle Tone: within normal limits Gait & Station: normal Patient leans: N/A  Psychiatric Specialty Exam: Physical Exam  Nursing  note and vitals reviewed.   Review of Systems  Psychiatric/Behavioral: Positive  for hallucinations.  All other systems reviewed and are negative.   Blood pressure 119/81, pulse 71, temperature 98.1 F (36.7 C), temperature source Oral, resp. rate 16, height 6' (1.829 m), weight 66.2 kg (146 lb), SpO2 100 %.Body mass index is 19.8 kg/m.  General Appearance: Guarded  Eye Contact:  Poor  Speech:  limited  Volume:  Normal  Mood:  Anxious and Depressed  Affect:  Congruent  Thought Process:  Coherent  Orientation:  Full (Time, Place, and Person)  Thought Content:  Hallucinations: Auditory die and Rumination  Suicidal thoughts: is hearing command AH  Homicidal Thoughts:  No  Memory:  Immediate;   Fair Recent;   Fair Remote;   Fair  Judgement:  Fair  Insight:  Fair  Psychomotor Activity:  Restlessness  Concentration:  Concentration: Fair  Recall:  FiservFair  Fund of Knowledge:  Fair  Language:  Good  Akathisia:  No  Handed:  Right  AIMS (if indicated):     Assets:  ArchitectCommunication Skills Financial Resources/Insurance Social Support  ADL's:  Intact  Cognition:  WNL  Sleep:  Number of Hours: 6.25     Treatment Plan Summary:David Novak is a 30 y.o.  AA male, who has a hx of schizoaffective do , [facility]  [facility] being hyper[facility] , anxious and agitated and being psychotic.Patient continues to be psychotic - will need IP stay. Daily contact with patient to assess and evaluate symptoms and progress in treatment and Medication management   Continue with  Lithium 450 mg PO BID q 12 h for mood sx. Li level in 5 days . Will increase Haldol to 2.5 mg po bid for AH/mood sx. Will continue Cogentin 0.5 mg po bid for EPS. Continue with Trazodone 100 mg PO qhs  for insomnia Contiune Norvasc 5 mg PO daily  for HTN Will continue to monitor vitals ,medication compliance and treatment side effects while patient is here.  Reviewed labs li level - 0.35 , lipid panel - elevated - will get dietician consult. CSW will continue working on disposition.  Patient to  participate in therapeutic milieu  Admir Candelas, MD 01/27/2016, 3:19 PM

## 2016-01-27 NOTE — Progress Notes (Signed)
DAR NOTE: Pt present with flat affect and depressed mood in the unit. Pt has been visible in the milieu watching TV with peers. Pt has been calm and cooperative with the care. Pt denies physical pain, took all his meds as scheduled. As per self inventory, pt had a good night sleep, good appetite, normal energy, and good concentration. Pt rate depression at 6, hopeless ness at 5, and anxiety at 5. Pt's safety ensured with 15 minute and environmental checks. Pt currently denies SI/HI and A/V hallucinations. Pt verbally agrees to seek staff if SI/HI or A/VH occurs and to consult with staff before acting on these thoughts. Will continue POC.

## 2016-01-27 NOTE — Progress Notes (Signed)
Recreation Therapy Notes  Date: 01/27/16 Time: 1005 Location: 500 Hall Dayroom  Group Topic: Communication  Goal Area(s) Addresses:  Patient will effectively communicate with peers in group.  Patient will verbalize benefit of healthy communication. Patient will verbalize positive effect of healthy communication on post d/c goals.  Patient will identify communication techniques that made activity effective for group.   Behavioral Response: Engaged  Intervention: Bag, various items  Activity: What is it?  LRT placed various items in a bag.  Patients took turns taking an item from the bag and describing it to the rest of the group.  The remaining members of the group had to guess what was being described to them.  The person that guessed the item, would come up and pick a different item to describe.   Education: Communication, Discharge Planning  Education Outcome: Acknowledges understanding/In group clarification offered/Needs additional education.   Clinical Observations/Feedback:  Pt participated in group with some encouragement.  Pt expressed that he didn't know how communication would help him once discharged and that he needed to think about it.    Caroll RancherMarjette Elzia Hott, LRT/CTRS

## 2016-01-27 NOTE — Progress Notes (Signed)
Adult Psychoeducational Group Note  Date:  01/27/2016 Time:  9:47 PM  Group Topic/Focus:  Wrap-Up Group:   The focus of this group is to help patients review their daily goal of treatment and discuss progress on daily workbooks.   Participation Level:  Minimal  Participation Quality:  Appropriate  Affect:  Flat  Cognitive:  Appropriate  Insight: Appropriate  Engagement in Group:  Engaged  Modes of Intervention:  Socialization and Support  Additional Comments:Patient attended and participated in group tonight. He reports having a OK. He went to the gym and that help him to release stress. Today he went for meals, spoke with his doctor and took his medication. He is awaiting discharge. Lita MainsFrancis, Rosibel Giacobbe [facility] 01/27/2016, 9:47 PM

## 2016-01-28 LAB — HEMOGLOBIN A1C
HEMOGLOBIN A1C: 5 % (ref 4.8–5.6)
Mean Plasma Glucose: 97 mg/dL

## 2016-01-28 LAB — PROLACTIN: PROLACTIN: 26.6 ng/mL — AB (ref 4.0–15.2)

## 2016-01-28 MED ORDER — HALOPERIDOL 2 MG PO TABS
4.0000 mg | ORAL_TABLET | Freq: Two times a day (BID) | ORAL | Status: DC
  Administered 2016-01-28 – 2016-01-31 (×6): 4 mg via ORAL
  Filled 2016-01-28: qty 2
  Filled 2016-01-28 (×2): qty 28
  Filled 2016-01-28 (×9): qty 2

## 2016-01-28 NOTE — Progress Notes (Signed)
Recreation Therapy Notes  Date: 01/28/16 Time: 1000 Location: 500 Hall Dayroom  Group Topic: Communication, Team Building, Problem Solving  Goal Area(s) Addresses:  Patient will effectively work with peer towards shared goal.  Patient will identify skill used to make activity successful.  Patient will identify how skills used during activity can be used to reach post d/c goals.   Behavioral Response: Engaged  Intervention: [employer]HealthBeach ball  Activity: Keep It ContractorGoing Volleyball.  Patients will sit in Novak circle and pass the ball to various members of the group around the circle without letting the ball hit the ground.  Education: Pharmacist, communityocial Skills, Building control surveyorDischarge Planning.   Education Outcome: Acknowledges education/In group clarification offered/Needs additional education.   Clinical Observations/Feedback: Pt stated that teamwork is used to help you get to know people.  Pt expressed that to complete the activity the group had to work as Novak team and be consistent.  Pt explained that teamwork is important when you need somebody.  Pt stated that these skills can be used post discharge by "just doing them".     David Novak, David Novak        David Novak, David Novak 01/28/2016 11:08 AM

## 2016-01-28 NOTE — Progress Notes (Signed)
Patient ID: David Novak, David Novak   DOB: 1985-12-29, 30 y.o.   MRN: [id number]  D: Patient pleasant on approach. Affect brightens when speaking. Reports improvement and currently denies any issues. Denies any a/v hallucinations tonight. Contracts for safety. Made one religious comment but not overly preoccupied tonight. A: Staff will continue to monitor on q 15 minute checks, follow treatment plan, and give medications as ordered. R: Took medications as ordered. No issues

## 2016-01-28 NOTE — Progress Notes (Signed)
D: Pt A & O X3. Presents with depressed affect and mood. Pt is guarded with poor eye contact. Denies SI, HI and AVH when assessed, stated "I'm alright". Observed pacing hall earlier this shift wrapped in a blanket. A:Scheduled medications administered as prescribed. Verbal encouragement offered towards treatment compliance. Support and availability provided to pt. Safety maintained on Q 15 minutes checks without outburst.  R: Compliant with medications. Attended scheduled groups. Tolerated all PO intake well. Remains safe on and off unit.

## 2016-01-28 NOTE — [facility] Group Notes (Signed)
Patient attend group. Patients's day was a 6. His goal communicate more to be able to get alone with others. This is a slow process but he has tried to Berkshire Hathawaycomplish his goal.

## 2016-01-28 NOTE — Progress Notes (Signed)
D    Pt is guarded and and paranoid about his medications   Pt asked if he had to take them   He was also asking when he could leave here A   Explained to pt that I cant force him to take medications but he would be discharged quicker if he takes his medications and gets better    Encouraged him to ask his doctor about his concerns about his medications    Medications administered and effectiveness monitored R   Pt took medication and was receptive to talking to his doctor tomorrow

## 2016-01-28 NOTE — Progress Notes (Signed)
[facility] MD Progress Note  01/28/2016 4:34 PM David Novak  MRN:  [id number] Subjective:  Patient reports " I am fine."    Objective:  David Novak is a 30 y.o.  AA male, who has a hx of schizoaffective do , who  presented to [facility] with GPD for being hyperactive , anxious and agitated and being psychotic.  Patient seen and chart reviewed.Discussed patient with treatment team. Patient today seen as guarded , avoids eye contact , is withdrawn , response to questions are minimal and in monosyllables. Per staff pt continues to need encouragement and support , has been tolerating medications well - will continue treatment.      Principal Problem: Schizoaffective disorder, bipolar type (HCC) Diagnosis:   Patient Active Problem List   Diagnosis Date Noted  . Schizoaffective disorder, bipolar type (HCC) [F25.0] 01/27/2016   Total Time spent with patient: 25 minutes  Past Psychiatric History: See Above  Past Medical History:  Past Medical History:  Diagnosis Date  . Depression   . Suicide attempt [facility])    History reviewed. No pertinent surgical history. Family History: denies hx of HTN, Diabetes , thyroid disease in family. Family Psychiatric  History: See H&P Social History:  History  Alcohol Use No     History  Drug use: Unknown    Social History   Social History  . Marital status: Single    Spouse name: N/A  . Number of children: N/A  . Years of education: N/A   Social History Main Topics  . Smoking status: Never Smoker  . Smokeless tobacco: Never Used  . Alcohol use No  . Drug use: Unknown  . Sexual activity: Not Asked   Other Topics Concern  . None   Social History Narrative  . None   Additional Social History:    Pain Medications: SEE MAR Prescriptions: SEE MAR Over the Counter: SEE MAR History of alcohol / drug use?: No history of alcohol / drug abuse                    Sleep: Fair  Appetite:  Fair  Current Medications: Current Facility-Administered  Medications  Medication Dose Route Frequency Provider Last Rate Last Dose  . acetaminophen (TYLENOL) tablet 650 mg  650 mg Oral Q6H PRN Charm Rings, NP      . alum & mag hydroxide-simeth (MAALOX/MYLANTA) 200-200-20 MG/5ML suspension 30 mL  30 mL Oral Q4H PRN Charm Rings, NP      . amLODipine (NORVASC) tablet 5 mg  5 mg Oral Daily Oneta Rack, NP   5 mg at 01/28/16 0835  . benztropine (COGENTIN) tablet 0.5 mg  0.5 mg Oral BID Charm Rings, NP   0.5 mg at 01/28/16 0835  . cloNIDine (CATAPRES) tablet 0.1 mg  0.1 mg Oral Once Oneta Rack, NP      . diphenhydrAMINE (BENADRYL) capsule 50 mg  50 mg Oral Q8H PRN Jomarie Longs, MD       Or  . diphenhydrAMINE (BENADRYL) injection 25 mg  25 mg Intramuscular Q8H PRN Zigmond Trela, MD      . haloperidol (HALDOL) tablet 2.5 mg  2.5 mg Oral BID Jomarie Longs, MD   2.5 mg at 01/28/16 0835  . haloperidol (HALDOL) tablet 5 mg  5 mg Oral Q8H PRN Jomarie Longs, MD       Or  . haloperidol lactate (HALDOL) injection 5 mg  5 mg Intramuscular Q8H PRN Jomarie Longs, MD      .  lithium carbonate (ESKALITH) CR tablet 450 mg  450 mg Oral Q12H Charm Rings, NP   450 mg at 01/28/16 0834  . LORazepam (ATIVAN) tablet 1 mg  1 mg Oral Q6H PRN Jomarie Longs, MD       Or  . LORazepam (ATIVAN) injection 1 mg  1 mg Intramuscular Q6H PRN Caitriona Sundquist, MD      . magnesium hydroxide (MILK OF MAGNESIA) suspension 30 mL  30 mL Oral Daily PRN Charm Rings, NP      . traZODone (DESYREL) tablet 100 mg  100 mg Oral QHS Charm Rings, NP   100 mg at 01/27/16 2111    Lab Results:  Results for orders placed or performed during the hospital encounter of 01/24/16 (from the past 48 hour(s))  Lithium level     Status: Abnormal   Collection Time: 01/27/16  6:30 AM  Result Value Ref Range   Lithium Lvl 0.35 (L) 0.60 - 1.20 mmol/L    Comment: Performed at [facility]  Prolactin     Status: Abnormal   Collection Time: 01/27/16  6:30 AM  Result  Value Ref Range   Prolactin 26.6 (H) 4.0 - 15.2 ng/mL    Comment: (NOTE) Performed At: [facility] [address] [place], [place] [id number] Mila Homer MD [id number] Performed at [facility]   Lipid panel     Status: Abnormal   Collection Time: 01/27/16  6:30 AM  Result Value Ref Range   Cholesterol 314 (H) 0 - 200 mg/dL   Triglycerides 70 <956 mg/dL   HDL 56 >21 mg/dL   Total CHOL/HDL Ratio 5.6 RATIO   VLDL 14 0 - 40 mg/dL   LDL Cholesterol 308 (H) 0 - 99 mg/dL    Comment:        Total Cholesterol/HDL:CHD Risk Coronary Heart Disease Risk Table                     Men   Women  1/2 Average Risk   3.4   3.3  Average Risk       5.0   4.4  2 X Average Risk   9.6   7.1  3 X Average Risk  23.4   11.0        Use the calculated Patient Ratio above and the CHD Risk Table to determine the patient's CHD Risk.        ATP III CLASSIFICATION (LDL):  <100     mg/dL   Optimal  657-846  mg/dL   Near or Above                    Optimal  130-159  mg/dL   Borderline  962-952  mg/dL   High  >841     mg/dL   Very High Performed at [facility]   TSH     Status: None   Collection Time: 01/27/16  6:30 AM  Result Value Ref Range   TSH 0.368 0.350 - 4.500 uIU/mL    Comment: Performed at [facility]  Hemoglobin A1c     Status: None   Collection Time: 01/27/16  6:30 AM  Result Value Ref Range   Hgb A1c MFr Bld 5.0 4.8 - 5.6 %    Comment: (NOTE)         Pre-diabetes: 5.7 - 6.4         Diabetes: >6.4  Glycemic control for adults with diabetes: <7.0    Mean Plasma Glucose 97 mg/dL    Comment: (NOTE) Performed At: [facility] [address] [place], [place] [id number] Mila Homer MD WU:[id number] Performed at [facility]     Blood Alcohol level:  Lab Results  Component Value Date   [facility] <5 01/23/2016    Metabolic Disorder Labs: Lab Results  Component Value Date   HGBA1C 5.0  01/27/2016   MPG 97 01/27/2016   Lab Results  Component Value Date   PROLACTIN 26.6 (H) 01/27/2016   Lab Results  Component Value Date   CHOL 314 (H) 01/27/2016   TRIG 70 01/27/2016   HDL 56 01/27/2016   CHOLHDL 5.6 01/27/2016   VLDL 14 01/27/2016   LDLCALC 244 (H) 01/27/2016    Physical Findings: AIMS: Facial and Oral Movements Muscles of Facial Expression: None, normal Lips and Perioral Area: None, normal Jaw: None, normal Tongue: None, normal,Extremity Movements Upper (arms, wrists, hands, fingers): None, normal Lower (legs, knees, ankles, toes): None, normal, Trunk Movements Neck, shoulders, hips: None, normal, Overall Severity Severity of abnormal movements (highest score from questions above): None, normal Incapacitation due to abnormal movements: None, normal Patient's awareness of abnormal movements (rate only patient's report): No Awareness, Dental Status Current problems with teeth and/or dentures?: No Does patient usually wear dentures?: No  CIWA:    COWS:     Musculoskeletal: Strength & Muscle Tone: within normal limits Gait & Station: normal Patient leans: N/A  Psychiatric Specialty Exam: Physical Exam  Nursing note and vitals reviewed.   Review of Systems  Psychiatric/Behavioral: Positive for hallucinations.  All other systems reviewed and are negative.   Blood pressure 126/84, pulse 74, temperature 98.4 F (36.9 C), temperature source Oral, resp. rate 20, height 6' (1.829 m), weight 66.2 kg (146 lb), SpO2 100 %.Body mass index is 19.8 kg/m.  General Appearance: Guarded  Eye Contact:  Poor  Speech:  limited  Volume:  Normal  Mood:  Anxious and Depressed  Affect:  Congruent  Thought Process:  Coherent  Orientation:  Full (Time, Place, and Person)  Thought Content:  Hallucinations: Auditory die and Rumination  Suicidal thoughts: is hearing command AH  Homicidal Thoughts:  No  Memory:  Immediate;   Fair Recent;   Fair Remote;   Fair   Judgement:  Fair  Insight:  Fair  Psychomotor Activity:  Restlessness  Concentration:  Concentration: Fair  Recall:  [employer] of Knowledge:  Fair  Language:  Good  Akathisia:  No  Handed:  Right  AIMS (if indicated):     Assets:  Architect Social Support  ADL's:  Intact  Cognition:  WNL  Sleep:  Number of Hours: 5.75     Treatment Plan Summary:Marin Yanes is a 30 y.o.  AA male, who has a hx of schizoaffective do , who  presented to [facility] with GPD for being hyperactive , anxious and agitated and being psychotic.Patient continues to be psychotic - will need IP stay. Daily contact with patient to assess and evaluate symptoms and progress in treatment and Medication management   Continue with  Lithium 450 mg PO BID q 12 h for mood sx. Li level in 5 days - 02/01/16. Will increase Haldol to 4  mg po bid for AH/mood sx. Will continue Cogentin 0.5 mg po bid for EPS. Continue with Trazodone 100 mg PO qhs  for insomnia Contiune Norvasc 5 mg PO daily  for HTN  Will continue to monitor vitals ,medication compliance and treatment side effects while patient is here.  Reviewed labs li level - 0.35 , lipid panel - elevated - ordered dietician consult. CSW will continue working on disposition.  Patient to participate in therapeutic milieu  David Culley, MD 01/28/2016, 4:34 PM

## 2016-01-28 NOTE — Progress Notes (Signed)
Recreation Therapy Notes  Animal-Assisted Activity (AAA) Program Checklist/Progress Notes Patient Eligibility Criteria Checklist & Daily Group note for Rec Tx Intervention  Date: 08.08.2017 Time: 2:45pm [address]    AAA/T Program Assumption of Risk Form signed by Patient/ or Parent Legal Guardian Yes  Patient is free of allergies or sever asthma Yes  Patient reports no fear of animals Yes  Patient reports no history of cruelty to animals Yes  Patient understands his/her participation is voluntary Yes  Patient washes hands before animal contact Yes  Patient washes hands after animal contact Yes  Behavioral Response: Appropriate, Engaged   Education: Charity fundraiserHand Washing, Appropriate Animal Interaction   Education Outcome: Acknowledges education.   Clinical Observations/Feedback: Patient discussed with MD for appropriateness in pet therapy session. Both LRT and MD agree patient is appropriate for participation. Patient offered participation in session and signed necessary consent form without issue. Patient attended session in its entirety, petting therapy dog and engaging with peers appropriately. Patient smiled brightly while petting therapy dog.   Marykay Lexenise L Mansur Patti, LRT/CTRS  Linsey Hirota L 01/28/2016 4:09 PM

## 2016-01-28 NOTE — Plan of Care (Signed)
Problem: Health Behavior/Discharge Planning: Goal: Compliance with prescribed medication regimen will improve Compliance with prescribed medication regimen will improve Pt has been medication compliant. Denies adverse drug reactions when assessed.

## 2016-01-28 NOTE — [facility] Group Notes (Signed)
[facility] LCSW Group Therapy  01/28/2016 3:23 PM   Type of Therapy:  Group Therapy  Participation Level:  Active  Participation Quality:  Attentive  Affect:  Appropriate  Cognitive:  Appropriate  Insight:  Improving  Engagement in Therapy:  Engaged  Modes of Intervention:  Clarification, Education, Exploration and Socialization  Summary of Progress/Problems: Today's group focused on relapse prevention.  We defined the term, and then brainstormed on ways to prevent relapse. David Novak stayed the entire time, semi engaged.  Appeared to be tuned out, but able to respond when asked directly.  Cited his faith as the thing that sustains him and keeps him going forward.  "It's all I got."   David Novak, Makenah Karas B 01/28/2016 , 3:23 PM

## 2016-01-29 NOTE — [facility] Group Notes (Signed)
[facility]  01/29/2016 , 1:38 PM    Type of Therapy:  [facility] Presentation  Participation Level:  Active  Participation Quality:  Attentive  Affect:  Blunted  Cognitive:  Oriented  Insight:  Limited  Engagement in Therapy:  Engaged  Modes of Intervention:  Discussion, Education and Socialization  Summary of Progress/Problems:  Onalee HuaDavid from [facility] came to present his recovery story and play the guitar.  Stayed the entire time.  Slept.  Daryel Geraldorth, Avaree Gilberti B 01/29/2016 , 1:38 PM

## 2016-01-29 NOTE — Progress Notes (Signed)
Patient ID: David Novak, male   DOB: 09-10-1985, 30 y.o.   MRN: [id number] [facility] MD Progress Note  01/29/2016 4:19 PM David Novak  MRN:  [id number] Subjective: Patient seen, chart reviewed and case discussed with nursing staff. Per report, patient has been guarded and isolates himself.   He states that he feels better today. He reports fair sleep. He denies AH. He states that his mother was concerned about him as he was talking about god. He denies SI/HI. He denies side effect from medication.   Principal Problem: Schizoaffective disorder, bipolar type (HCC) Diagnosis:   Patient Active Problem List   Diagnosis Date Noted  . Schizoaffective disorder, bipolar type (HCC) [F25.0] 01/27/2016   Total Time spent with patient: 25 minutes  Past Psychiatric History: See Above  Past Medical History:  Past Medical History:  Diagnosis Date  . Depression   . Suicide attempt [facility])    History reviewed. No pertinent surgical history. Family History: denies hx of HTN, Diabetes , thyroid disease in family. Family Psychiatric  History: See H&P Social History:  History  Alcohol Use No     History  Drug use: Unknown    Social History   Social History  . Marital status: Single    Spouse name: N/A  . Number of children: N/A  . Years of education: N/A   Social History Main Topics  . Smoking status: Never Smoker  . Smokeless tobacco: Never Used  . Alcohol use No  . Drug use: Unknown  . Sexual activity: Not Asked   Other Topics Concern  . None   Social History Narrative  . None   Additional Social History:    Pain Medications: SEE MAR Prescriptions: SEE MAR Over the Counter: SEE MAR History of alcohol / drug use?: No history of alcohol / drug abuse                    Sleep: Fair  Appetite:  Fair  Current Medications: Current Facility-Administered Medications  Medication Dose Route Frequency Provider Last Rate Last Dose  . acetaminophen (TYLENOL) tablet 650 mg  650 mg Oral  Q6H PRN Charm Rings, NP      . alum & mag hydroxide-simeth (MAALOX/MYLANTA) 200-200-20 MG/5ML suspension 30 mL  30 mL Oral Q4H PRN Charm Rings, NP      . amLODipine (NORVASC) tablet 5 mg  5 mg Oral Daily Oneta Rack, NP   5 mg at 01/29/16 0813  . benztropine (COGENTIN) tablet 0.5 mg  0.5 mg Oral BID Charm Rings, NP   0.5 mg at 01/29/16 0814  . cloNIDine (CATAPRES) tablet 0.1 mg  0.1 mg Oral Once Oneta Rack, NP      . diphenhydrAMINE (BENADRYL) capsule 50 mg  50 mg Oral Q8H PRN Jomarie Longs, MD       Or  . diphenhydrAMINE (BENADRYL) injection 25 mg  25 mg Intramuscular Q8H PRN Jomarie Longs, MD      . haloperidol (HALDOL) tablet 4 mg  4 mg Oral BID Jomarie Longs, MD   4 mg at 01/29/16 0813  . haloperidol (HALDOL) tablet 5 mg  5 mg Oral Q8H PRN Jomarie Longs, MD       Or  . haloperidol lactate (HALDOL) injection 5 mg  5 mg Intramuscular Q8H PRN Saramma Eappen, MD      . lithium carbonate (ESKALITH) CR tablet 450 mg  450 mg Oral Q12H Charm Rings, NP   450 mg at  01/29/16 0813  . LORazepam (ATIVAN) tablet 1 mg  1 mg Oral Q6H PRN Jomarie LongsSaramma Eappen, MD       Or  . LORazepam (ATIVAN) injection 1 mg  1 mg Intramuscular Q6H PRN Saramma Eappen, MD      . magnesium hydroxide (MILK OF MAGNESIA) suspension 30 mL  30 mL Oral Daily PRN Charm RingsJamison Y Lord, NP      . traZODone (DESYREL) tablet 100 mg  100 mg Oral QHS Charm RingsJamison Y Lord, NP   100 mg at 01/28/16 2132    Lab Results:  No results found for this or any previous visit (from the past 48 hour(s)).  Blood Alcohol level:  Lab Results  Component Value Date   ETH <5 01/23/2016    Metabolic Disorder Labs: Lab Results  Component Value Date   HGBA1C 5.0 01/27/2016   MPG 97 01/27/2016   Lab Results  Component Value Date   PROLACTIN 26.6 (H) 01/27/2016   Lab Results  Component Value Date   CHOL 314 (H) 01/27/2016   TRIG 70 01/27/2016   HDL 56 01/27/2016   CHOLHDL 5.6 01/27/2016   VLDL 14 01/27/2016   LDLCALC 244 (H)  01/27/2016    Physical Findings: AIMS: Facial and Oral Movements Muscles of Facial Expression: None, normal Lips and Perioral Area: None, normal Jaw: None, normal Tongue: None, normal,Extremity Movements Upper (arms, wrists, hands, fingers): None, normal Lower (legs, knees, ankles, toes): None, normal, Trunk Movements Neck, shoulders, hips: None, normal, Overall Severity Severity of abnormal movements (highest score from questions above): None, normal Incapacitation due to abnormal movements: None, normal Patient's awareness of abnormal movements (rate only patient's report): No Awareness, Dental Status Current problems with teeth and/or dentures?: No Does patient usually wear dentures?: No  CIWA:    COWS:     Musculoskeletal: Strength & Muscle Tone: within normal limits Gait & Station: normal Patient leans: N/A  Psychiatric Specialty Exam: Physical Exam  Nursing note and vitals reviewed. Constitutional: He is oriented to person, place, and time. He appears well-developed and well-nourished.  Neurological: He is alert and oriented to person, place, and time.  No tremors, no rigidity, no nystagmus    Review of Systems  Psychiatric/Behavioral: Negative for hallucinations.  All other systems reviewed and are negative.   Blood pressure 136/83, pulse 80, temperature 98.5 F (36.9 C), resp. rate 18, height 6' (1.829 m), weight 146 lb (66.2 kg), SpO2 100 %.Body mass index is 19.8 kg/m.  General Appearance: Fairly Groomed  Eye Contact:  Fair  Speech:  limited  Volume:  Normal  Mood:  better  Affect:  Congruent  Thought Process:  Coherent  Orientation:  Full (Time, Place, and Person)  Thought Content:  no paranoia. Perceptions: denies AH/VH  Suicidal thoughts: denies  Homicidal Thoughts:  No  Memory:  Immediate;   Fair Recent;   Fair Remote;   Fair  Judgement:  Fair  Insight:  Fair  Psychomotor Activity:  Normal  Concentration:  Concentration: Fair  Recall:  Eastman KodakFair   Fund of Knowledge:  Fair  Language:  Good  Akathisia:  No  Handed:  Right  AIMS (if indicated):     Assets:  ArchitectCommunication Skills Financial Resources/Insurance Social Support  ADL's:  Intact  Cognition:  WNL  Sleep:  Number of Hours: 5.5    Assessment Omarrion Golightly is a 30 y.o.  AA male, who has a hx of schizoaffective do , who  [facility] being hyperactive , anxious and  agitated and being psychotic.  There is significant improvement in his psychotic symptoms since adjustment of his medication. He has been tolerating these medication well without significant side effect. Continue current regimen. Will obtain lithium level assuming that it has reached to steady level since starting the current dose.   Continue with  Lithium 450 mg PO BID q 12 h for mood sx. (lithium level 0.35 on 8/7; recheck tomorrow)  Will increase Haldol to 2.5 mg po bid for AH/mood sx. Will continue Cogentin 0.5 mg po bid for EPS. Continue with Trazodone 100 mg PO qhs  for insomnia Contiune Norvasc 5 mg PO daily  for HTN Will continue to monitor vitals ,medication compliance and treatment side effects while patient is here.  lipid panel - elevated - will get dietician consult. CSW will continue working on disposition.  Patient to participate in therapeutic milieu  Daily contact with patient to assess and evaluate symptoms and progress in treatment and Medication management    Neysa Hotter, MD 01/29/2016, 4:19 PM

## 2016-01-29 NOTE — Progress Notes (Signed)
DAR NOTE: Patient presents with anxious affect and depressed mood.  Denies pain, auditory and visual hallucinations.  Rates depression at 5, hopelessness at 5, and anxiety at 5.  Maintained on routine safety checks. Medications given as prescribed. Support  and encouragement offered as needed.  Attended group and participated.  States goal for today is  "communicate".  Patient observed socializing with peers in the dayroom.  Offered no complaint.

## 2016-01-29 NOTE — Progress Notes (Signed)
D    Pt is less guarded and less paranoid about his medications   Pt did have to be prompted twice to come get his medications   He was also asking when he could leave here A   Verbal support given    Medications administered and effectiveness monitored   Q 15 min checks R   Pt took medication and was receptive to talking to his doctor tomorrow

## 2016-01-29 NOTE — Progress Notes (Signed)
Recreation Therapy Notes  Date: 01/29/16 Time: 1000 Location: [address] Dayroom  Group Topic: Coping Skills  Goal Area(s) Addresses:  Patient will be able to identify stressors. Patient will be able to identify positive coping skills. Patient will be able to identify how using positive will help them once d/c.  Behavioral Response: Engaged  Intervention:  Worksheet, pencils  Activity: Financial traderWeb worksheet.  Pt is to identify all the things that keep them stuck and from moving forward in life and write it in the center of the spider web.  On the outside of the web, pt will identify positive coping skills they can use to help deal with their stressors and move forward.   Education: PharmacologistCoping Skills, Building control surveyorDischarge Planning.   Education Outcome: Acknowledges understanding/In group clarification offered/Needs additional education.   Clinical Observations/Feedback:  Pt stated he was here for depression and stress.  Some of the coping skills he named were taking his medications, communicating, exercise, swimming, watching television and riding his bike.    Caroll RancherMarjette Devlynn Knoff, LRT/CTRS

## 2016-01-30 LAB — LITHIUM LEVEL: LITHIUM LVL: 0.48 mmol/L — AB (ref 0.60–1.20)

## 2016-01-30 NOTE — Progress Notes (Signed)
Patient has been in his room for the majority of the shift. Patient has denied SI, HI, and AVH.  Patient has been encouraged OOB to groups.     Assess patient for safety, offer medications as prescribed, engage patient in 1:1 staff talks,   Continue to monitor for safety, patient able to contract for safety.

## 2016-01-30 NOTE — [facility] Suicide Risk Assessment (Addendum)
[facility] INPATIENT:  Family/Significant Other Suicide Prevention Education  Suicide Prevention Education:  Education Completed; No one has been identified by the patient as the family member/significant other with whom the patient will be residing, and identified as the person(s) who will aid the patient in the event of a mental health crisis (suicidal ideations/suicide attempt).  With written consent from the patient, the family member/significant other has been provided the following suicide prevention education, prior to the and/or following the discharge of the patient.  The suicide prevention education provided includes the following:  Suicide risk factors  Suicide prevention and interventions  National Suicide Hotline telephone number  [facility] assessment telephone number  [facility] [place] Emergency Assistance 911  [facility] and/or Residential Mobile Crisis Unit telephone number  Request made of family/significant other to:  Remove weapons (e.g., guns, rifles, knives), all items previously/currently identified as safety concern.    Remove drugs/medications (over-the-counter, prescriptions, illicit drugs), all items previously/currently identified as a safety concern.  The family member/significant other verbalizes understanding of the suicide prevention education information provided.  The family member/significant other agrees to remove the items of safety concern listed above.The patient did not endorse SI at the time of admission, nor did the patient c/o SI during the stay here.  SPE not required. I made several attempts to contact mother, David Novak, [phone], but was unable to connect with her.  01/31/16:  Reached mother today.  Based on her feedback about pt non-compliance with meds, we are giving David Novak Dec injection today.  David Novak [facility] second [facility]  David DaubRodney Novak [facility] 01/30/2016, 5:04 PM

## 2016-01-30 NOTE — Progress Notes (Signed)
Recreation Therapy Notes  Date: 01/30/16 Time: 1000 Location: [address] Dayroom  Group Topic: Leisure Education  Goal Area(s) Addresses:  Patient will identify positive leisure activities.  Patient will identify one positive benefit of participation in leisure activities.   Intervention: Magazines, scissors, glue sticks, construction paper  Activity: Got Rec?:  Patients were to look through magazines and cut out any pictures or words that described any positive leisure activities that they participate in or would like to participate in.  Patients were to take the pictures and words they found and glue them to the construction paper, creating a leisure collage.    Education:  Leisure Education, Discharge Planning  Education Outcome: Needs additional education  Clinical Observations/Feedback: Pt did not attend group.    Daxen Lanum, LRT/CTRS  

## 2016-01-30 NOTE — [facility] Group Notes (Signed)
[facility] Group Notes:  (Counselor/Nursing/MHT/Case Management/Adjunct)  01/30/2016 1:15PM  Type of Therapy:  Group Therapy  Participation Level:  Active  Participation Quality:  Appropriate  Affect:  Flat  Cognitive:  Oriented  Insight:  Improving  Engagement in Group:  Limited  Engagement in Therapy:  Limited  Modes of Intervention:  Discussion, Exploration and Socialization  Summary of Progress/Problems: The topic for group was balance in life.  Pt participated in the discussion about when their life was in balance and out of balance and how this feels.  Pt discussed ways to get back in balance and short term goals they can work on to get where they want to be. Stayed the entire time, slept on and off.  States he is balanced, and knows it because "God told me I am."  States he finds balance by staying around positive people [he was able to name several] and going for walks in his neighbor hood.   David Novak, David Novak 01/30/2016 3:18 PM

## 2016-01-30 NOTE — Progress Notes (Signed)
  [facility] :  Will you be returning to the same living situation after discharge:  Yes,  home At discharge, do you have transportation home?: Yes,  family Do you have the ability to pay for your medications: Yes,  mental health  Release of information consent forms completed and in the chart;  Patient's signature needed at discharge.  Patient to Follow up at: Follow-up Information    [facility] .   Specialty:  Behavioral Health Why:  Go to the walk-in clinic M-F between 8 and 10AM for your hospital follow up appointment Contact information: [address] [place] [address] [phone]           Next level of care provider has access to [facility] Link:no  Safety Planning and Suicide Prevention discussed: Yes,  yes  Have you used any form of tobacco in the last 30 days? (Cigarettes, Smokeless Tobacco, Cigars, and/or Pipes): No  Has patient been referred to the Quitline?: N/A patient is not a smoker  Patient has been referred for addiction treatment: N/A  David Novak 01/30/2016, 5:19 PM

## 2016-01-30 NOTE — Tx Team (Signed)
Interdisciplinary Treatment Plan Update (Adult)  Date:  01/30/2016   Time Reviewed:  12:29 PM   Progress in Treatment: Attending groups: Yes. Participating in groups:  Minimally Taking medication as prescribed:  Yes. Tolerating medication:  Yes. Family/Significant other contact made:  No Patient understands diagnosis:  Yes  As evidenced by seeking help with AH Discussing patient identified problems/goals with staff:  Yes, see initial care plan. Medical problems stabilized or resolved:  Yes. Denies suicidal/homicidal ideation: Yes. Issues/concerns per patient self-inventory:  No. Other:  New problem(s) identified:  Discharge Plan or Barriers:see below  Reason for Continuation of Hospitalization: Depression Hallucinations Medication stabilization  Comments:  David Novak is an 30 y.o. male. He presents to [facility] with [facility]. Patient lives with mother and siblings. He was reportedly pacing and religiously preoccupied. Patient's mother called 911/GPD for help. Patient voluntarily came to [facility] with [facility]PD. Patient is very religious. He sts, "I was told 2 weeks that I would die.Marland KitchenMarland KitchenMarland KitchenGod told me". Patient sts that since then he has been unable to function worried about his death. Patient sts that he often hears voices of the "God and the Devil". Sts that the voices of God tell him good things and voices of the Devil tell him to do bad things. Sts that the devils voices has told him to steal. He has acted on the hallucinations and stole things. He reports hearing voices for the past 5 yrs Continue with Lithium 450 mg PO BID q 12 h for mood sx. Li level in 5 days . Will increase Haldol to 2.5 mg po bid for AH/mood sx. Will continue Cogentin 0.5 mg po bid for EPS. Continue with Trazodone 100 mg PO qhs  for insomnia  Estimated length of stay: Likley d/c tomorrow  New goal(s):  Review of initial/current patient goals per problem list:   Review of initial/current patient goals per problem list:  1.  Goal(s): Patient will participate in aftercare plan   Met: Yes   Target date: 3-5 days post admission date   As evidenced by: Patient will participate within aftercare plan AEB aftercare provider and housing plan at discharge being identified. 01/27/16:  Return home, follow up outpt   2. Goal (s): Patient will exhibit decreased depressive symptoms and suicidal ideations.   Met: Yes   Target date: 3-5 days post admission date   As evidenced by: Patient will utilize self rating of depression at 3 or below and demonstrate decreased signs of depression or be deemed stable for discharge by MD. 01/27/16:  Rates depression a 6 today 01/30/16:  Denies depression     3. Goal(s): Patient will demonstrate decreased signs and symptoms of anxiety.   Met: Yes   Target date: 3-5 days post admission date   As evidenced by: Patient will utilize self rating of anxiety at 3 or below and demonstrated decreased signs of anxiety, or be deemed stable for discharge by MD 01/27/16:  Rates anxiety a 5 today 01/30/16:  Denies anxiety  5. Goal(s): Patient will demonstrate decreased signs of psychosis  * Met: Yes  * Target date: 3-5 days post admission date  * As evidenced by: Patient will demonstrate decreased frequency of AVH or return to baseline function 01/27/16:  Pt endorsing AH 01/30/16:  Pt denies all symptoms today         Attendees: Patient:  01/30/2016 12:29 PM   Family:   01/30/2016 12:29 PM   Physician:  Ursula Alert, MD 01/30/2016 12:29 PM   Nursing:  Hedy Jacob, RN 01/30/2016 12:29 PM   CSW:    Roque Lias, LCSW   01/30/2016 12:29 PM   Other:  01/30/2016 12:29 PM   Other:   01/30/2016 12:29 PM   Other:  Lars Pinks, Nurse CM 01/30/2016 12:29 PM   Other:   01/30/2016 12:29 PM   Other:  Norberto Sorenson, [place]  01/30/2016 12:29 PM   Other:  01/30/2016 12:29 PM   Other:  01/30/2016 12:29 PM   Other:  01/30/2016 12:29 PM   Other:  01/30/2016 12:29 PM   Other:  01/30/2016  12:29 PM   Other:   01/30/2016 12:29 PM    Scribe for Treatment Team:   Trish Mage, 01/30/2016 12:29 PM

## 2016-01-30 NOTE — Progress Notes (Signed)
Patient ID: David Novak, male   DOB: 1985-08-22, 30 y.o.   MRN: [id number] [facility] MD Progress Note  01/30/2016 4:34 PM David Novak  MRN:  [id number] Subjective: Patient seen, chart reviewed and case discussed with nursing staff.   He states that he feels better today. He would like to continue the same dose of lithium rather than increasing the dose. He denies SI/HI. Denies AH/VH   Principal Problem: Schizoaffective disorder, bipolar type (HCC) Diagnosis:   Patient Active Problem List   Diagnosis Date Noted  . Schizoaffective disorder, bipolar type (HCC) [F25.0] 01/27/2016   Total Time spent with patient: 15 minutes  Past Psychiatric History: See Above  Past Medical History:  Past Medical History:  Diagnosis Date  . Depression   . Suicide attempt [facility])    History reviewed. No pertinent surgical history. Family History: denies hx of HTN, Diabetes , thyroid disease in family. Family Psychiatric  History: See H&P Social History:  History  Alcohol Use No     History  Drug use: Unknown    Social History   Social History  . Marital status: Single    Spouse name: N/A  . Number of children: N/A  . Years of education: N/A   Social History Main Topics  . Smoking status: Never Smoker  . Smokeless tobacco: Never Used  . Alcohol use No  . Drug use: Unknown  . Sexual activity: Not Asked   Other Topics Concern  . None   Social History Narrative  . None   Additional Social History:    Pain Medications: SEE MAR Prescriptions: SEE MAR Over the Counter: SEE MAR History of alcohol / drug use?: No history of alcohol / drug abuse                    Sleep: Fair  Appetite:  Fair  Current Medications: Current Facility-Administered Medications  Medication Dose Route Frequency Provider Last Rate Last Dose  . acetaminophen (TYLENOL) tablet 650 mg  650 mg Oral Q6H PRN Charm Rings, NP      . alum & mag hydroxide-simeth (MAALOX/MYLANTA) 200-200-20 MG/5ML suspension 30 mL   30 mL Oral Q4H PRN Charm Rings, NP      . amLODipine (NORVASC) tablet 5 mg  5 mg Oral Daily Oneta Rack, NP   5 mg at 01/30/16 0819  . benztropine (COGENTIN) tablet 0.5 mg  0.5 mg Oral BID Charm Rings, NP   0.5 mg at 01/30/16 0818  . cloNIDine (CATAPRES) tablet 0.1 mg  0.1 mg Oral Once Oneta Rack, NP      . diphenhydrAMINE (BENADRYL) capsule 50 mg  50 mg Oral Q8H PRN Jomarie Longs, MD       Or  . diphenhydrAMINE (BENADRYL) injection 25 mg  25 mg Intramuscular Q8H PRN Jomarie Longs, MD      . haloperidol (HALDOL) tablet 4 mg  4 mg Oral BID Jomarie Longs, MD   4 mg at 01/30/16 0819  . haloperidol (HALDOL) tablet 5 mg  5 mg Oral Q8H PRN Jomarie Longs, MD       Or  . haloperidol lactate (HALDOL) injection 5 mg  5 mg Intramuscular Q8H PRN Saramma Eappen, MD      . lithium carbonate (ESKALITH) CR tablet 450 mg  450 mg Oral Q12H Charm Rings, NP   450 mg at 01/30/16 0818  . LORazepam (ATIVAN) tablet 1 mg  1 mg Oral Q6H PRN Jomarie Longs, MD  Or  . LORazepam (ATIVAN) injection 1 mg  1 mg Intramuscular Q6H PRN Jomarie LongsSaramma Eappen, MD      . magnesium hydroxide (MILK OF MAGNESIA) suspension 30 mL  30 mL Oral Daily PRN Charm RingsJamison Y Lord, NP      . traZODone (DESYREL) tablet 100 mg  100 mg Oral QHS Charm RingsJamison Y Lord, NP   100 mg at 01/29/16 2213    Lab Results:  Results for orders placed or performed during the hospital encounter of 01/24/16 (from the past 48 hour(s))  Lithium level     Status: Abnormal   Collection Time: 01/30/16  6:27 AM  Result Value Ref Range   Lithium Lvl 0.48 (L) 0.60 - 1.20 mmol/L    Comment: Performed at [facility]    Blood Alcohol level:  Lab Results  Component Value Date   [facility] 01/23/2016    Metabolic Disorder Labs: Lab Results  Component Value Date   HGBA1C 5.0 01/27/2016   MPG 97 01/27/2016   Lab Results  Component Value Date   PROLACTIN 26.6 (H) 01/27/2016   Lab Results  Component Value Date   CHOL 314 (H) 01/27/2016    TRIG 70 01/27/2016   HDL 56 01/27/2016   CHOLHDL 5.6 01/27/2016   VLDL 14 01/27/2016   LDLCALC 244 (H) 01/27/2016    Physical Findings: AIMS: Facial and Oral Movements Muscles of Facial Expression: None, normal Lips and Perioral Area: None, normal Jaw: None, normal Tongue: None, normal,Extremity Movements Upper (arms, wrists, hands, fingers): None, normal Lower (legs, knees, ankles, toes): None, normal, Trunk Movements Neck, shoulders, hips: None, normal, Overall Severity Severity of abnormal movements (highest score from questions above): None, normal Incapacitation due to abnormal movements: None, normal Patient's awareness of abnormal movements (rate only patient's report): No Awareness, Dental Status Current problems with teeth and/or dentures?: No Does patient usually wear dentures?: No  CIWA:    COWS:     Musculoskeletal: Strength & Muscle Tone: within normal limits Gait & Station: normal Patient leans: N/A  Psychiatric Specialty Exam: Physical Exam  Nursing note and vitals reviewed. Constitutional: He is oriented to person, place, and time. He appears well-developed and well-nourished.  Neurological: He is alert and oriented to person, place, and time.  No tremors, no rigidity, no nystagmus    Review of Systems  Psychiatric/Behavioral: Negative for hallucinations.  All other systems reviewed and are negative.   Blood pressure 114/75, pulse 97, temperature 98.7 F (37.1 C), temperature source Oral, resp. rate 18, height 6' (1.829 m), weight 146 lb (66.2 kg), SpO2 100 %.Body mass index is 19.8 kg/m.  General Appearance: Fairly Groomed  Eye Contact:  Good  Speech:  limited  Volume:  Normal  Mood:  better  Affect:  Congruent and less restricted  Thought Process:  Coherent  Orientation:  Full (Time, Place, and Person)  Thought Content:  no paranoia. Perceptions: denies AH/VH  Suicidal thoughts: denies  Homicidal Thoughts:  No  Memory:  Immediate;    Fair Recent;   Fair Remote;   Fair  Judgement:  Fair  Insight:  Fair  Psychomotor Activity:  Normal  Concentration:  Concentration: Fair  Recall:  FiservFair  Fund of Knowledge:  Fair  Language:  Good  Akathisia:  No  Handed:  Right  AIMS (if indicated):     Assets:  ArchitectCommunication Skills Financial Resources/Insurance Social Support  ADL's:  Intact  Cognition:  WNL  Sleep:  Number of Hours: 5.5    Assessment Tait  Mckenney is a 30 y.o.  AA male, who has a hx of schizoaffective do , who  presented to [facility] with GPD for being hyperactive , anxious and agitated and being psychotic.  There is significant improvement in his psychotic symptoms since adjustment of his medication. He has been tolerating these medication well without significant side effect. Although it is preferable to uptitrate lithium given the level today, he prefers to continue current dose. Will continue the current dose to reinforce sense of autonomy which would be helpful for his adherence. Will defer further change to his outpatient provider.   Plans Continue with  Lithium 450 mg PO BID q 12 h for mood sx. (lithium level 0.48 on 8/10)  Continue Haldol 2.5 mg po bid for AH/mood sx. Will continue Cogentin 0.5 mg po bid for EPS. Continue with Trazodone 100 mg PO qhs  for insomnia Contiune Norvasc 5 mg PO daily  for HTN Will continue to monitor vitals ,medication compliance and treatment side effects while patient is here.  lipid panel - elevated - will get dietician consult. CSW will continue working on disposition.  Patient to participate in therapeutic milieu  Daily contact with patient to assess and evaluate symptoms and progress in treatment and Medication management    Neysa Hotter, MD 01/30/2016, 4:34 PM

## 2016-01-31 DIAGNOSIS — F250 Schizoaffective disorder, bipolar type: Principal | ICD-10-CM

## 2016-01-31 MED ORDER — TRAZODONE HCL 100 MG PO TABS: 100.0000 mg | ORAL_TABLET | Freq: Every day | ORAL | 0 refills | Status: DC

## 2016-01-31 MED ORDER — BENZTROPINE MESYLATE 0.5 MG PO TABS: 0.5000 mg | ORAL_TABLET | Freq: Two times a day (BID) | ORAL | 0 refills | Status: DC

## 2016-01-31 MED ORDER — AMLODIPINE BESYLATE 5 MG PO TABS: 5.0000 mg | ORAL_TABLET | Freq: Every day | ORAL | 0 refills | Status: DC

## 2016-01-31 MED ORDER — HALOPERIDOL DECANOATE 100 MG/ML IM SOLN
80.0000 mg | Freq: Once | INTRAMUSCULAR | Status: AC
  Administered 2016-01-31: 80 mg via INTRAMUSCULAR
  Filled 2016-01-31: qty 0.8

## 2016-01-31 MED ORDER — LITHIUM CARBONATE ER 450 MG PO TBCR: 450.0000 mg | EXTENDED_RELEASE_TABLET | Freq: Two times a day (BID) | ORAL | 0 refills | Status: DC

## 2016-01-31 MED ORDER — HALOPERIDOL 2 MG PO TABS: 4.0000 mg | ORAL_TABLET | Freq: Two times a day (BID) | ORAL | 0 refills | Status: DC

## 2016-01-31 MED ORDER — HALOPERIDOL DECANOATE 100 MG/ML IM SOLN: 80.0000 mg | Freq: Once | INTRAMUSCULAR | 0 refills | Status: DC

## 2016-01-31 NOTE — Progress Notes (Signed)
D   Pt continues to be paranoid and needs to be prompted to get his medications more than once    He attended karaoke group this evening  A   Verbal support given   Medications administered and effectiveness monitored   Q 15 min checks  R    Pt safe at present

## 2016-01-31 NOTE — [facility] Suicide Risk Assessment (Signed)
[facility]   Principal Problem: Schizoaffective disorder, bipolar type [facility](HCC) Discharge Diagnoses:  Patient Active Problem List   Diagnosis Date Noted  . Schizoaffective disorder, bipolar type (HCC) [F25.0] 01/27/2016    Total Time spent with patient: 15 minutes  Musculoskeletal: Strength & Muscle Tone: within normal limits Gait & Station: normal Patient leans: N/A  Psychiatric Specialty Exam: Review of Systems  Psychiatric/Behavioral: Negative for hallucinations, substance abuse and suicidal ideas. The patient does not have insomnia.   All other systems reviewed and are negative.   Blood pressure 123/80, pulse 80, temperature 98 F (36.7 C), temperature source Oral, resp. rate 16, height 6' (1.829 m), weight 146 lb (66.2 kg), SpO2 100 %.Body mass index is 19.8 kg/m.  General Appearance: Fairly Groomed  Patent attorneyye Contact::  Good  Speech:  Normal Rate  Volume:  Normal  Mood:  Euthymic  Affect:  less restricted, smiles  Thought Process:  Coherent and Goal Directed  Orientation:  Full (Time, Place, and Person)  Thought Content:  Logical No paranoia. Denies AH/VH  Suicidal Thoughts:  No  Homicidal Thoughts:  No  Memory:  Negative  Judgement:  Fair  Insight:  Present  Psychomotor Activity:  Normal  Concentration:  Negative  Recall:  Good  Fund of Knowledge:Fair  Language: Fair  Akathisia:  No  Handed:  Right  AIMS (if indicated):     Assets:  Communication Skills Desire for Improvement  Sleep:  Number of Hours: 5.25  Cognition: WNL  ADL's:  Intact   Mental Status Per Nursing Assessment::   On Admission:  NA  Demographic Factors:  Male and Unemployed  Loss Factors: Loss of significant relationship Father in law deceased a couple of years ago  Historical Factors: Prior suicide attempts and Family history of mental illness or substance abuse Suicide attempt in May 2017, jump outside of the apartment 6th floor  Nephew: ADHD  Risk  Reduction Factors:   Positive social support, Positive therapeutic relationship and Positive coping skills or problem solving skills  Continued Clinical Symptoms:  Schizophrenia:   Paranoid or undifferentiated type  Cognitive Features That Contribute To Risk:  None    Suicide Risk:  Mild:  Suicidal ideation of limited frequency, intensity, duration, and specificity.  There are no identifiable plans, no associated intent, mild dysphoria and related symptoms, good self-control (both objective and subjective assessment), few other risk factors, and identifiable protective factors, including available and accessible social support.  Follow-up Information    [facility] Follow up on 02/26/2016.   Specialty:  Behavioral Health Why:  Wednesday at 3:00 with Dr Solon PalmMengistu  You will get your next injection on this date.   Contact information: [address] [place] [address] [phone]           Plan Of Care/Follow-up recommendations:  Activity:  full Diet:  regular Tests:  none Other:  Will receive Haldol IM today. Will defer further medication regimen to his outpatient provider.    Ms. Thereasa DistanceRodney, [place] discussed the plans with his mother. She is concerned about his medication adherence. Will start Haldol Decanoate today and defer further medication changes to his outpatient provider. Risks including EPS/dystonia discussed with patient.   Neysa Hottereina Navi Erber, MD 01/31/2016, 11:59 AM

## 2016-01-31 NOTE — Progress Notes (Signed)
Data. Patient denies SI/HI/AVH.  Patient interacting well with staff and other patients. On his self assessment patient reports; 5/10 for depression and hopelessness and anxiety. His goal for today is: "Communicate".  Action. Emotional support and encouragement offered. Education provided on medication, indications and side effect. Q 15 minute checks done for safety. Response. Safety on the unit maintained through 15 minute checks.  Medications taken as prescribed. Attended groups. Remained calm and appropriate through out shift.  Patient discharged to lobby, to his mother. Belongings sheet reviewed and signed by patient and all belongings sent home, including medication samples ans scripts. Paperwork reviewed and patient able to verbalize understanding of education. Patient is in no current distress and ambulatory.

## 2016-01-31 NOTE — Progress Notes (Signed)
Recreation Therapy Notes  Date: 01/31/16 Time: 1015 Location: [address] Day Room  Group Topic: Stress Management  Goal Area(s) Addresses:  Patient will verbalize importance of using healthy stress management.  Patient will identify positive emotions associated with healthy stress management.   Behavioral Response:  Engaged  Intervention: Progressive Muscle Relaxation, Guided Imagery Script  Activity :  Progressive Muscle Relaxation, Peaceful Waves Scripts.  LRT introduced the techniques of PMR and Guided Imagery to the patients.  Patients were asked to follow along as LRT read scripts so they could participate in activity.  Education:  Stress Management, Discharge Planning.   Education Outcome: Acknowledges edcuation/In group clarification offered/Needs additional education  Clinical Observations/Feedback: Pt identified one way to combat stress was to talk to someone.  Pt expressed his muscles felt more relaxed and that it felt good to have a mental picture.   Caroll RancherMarjette Anjanae Woehrle, LRT/CTRS

## 2016-01-31 NOTE — Discharge Summary (Signed)
Physician Discharge Summary Note  Patient:  David Novak is an 30 y.o., male MRN:  [id number] DOB:  [birth year] Patient phone:  [phone] (home)  Patient address:   [address] [place] [address],  Total Time spent with patient: Greater than 30 minutes  Date of Admission:  01/24/2016  Date of Discharge: 01-31-16  Reason for Admission: Worsening symptom of psychosis.  Principal Problem: Schizoaffective disorder, bipolar type [facility])  Discharge Diagnoses: Patient Active Problem List   Diagnosis Date Noted  . Schizoaffective disorder, bipolar type (HCC) [F25.0] 01/27/2016   Past Psychiatric History: Schizoaffective disorder.  Past Medical History:  Past Medical History:  Diagnosis Date  . Depression   . Suicide attempt [facility])    History reviewed. No pertinent surgical history.  Family History: History reviewed. No pertinent family history.  Family Psychiatric  History: See H&P  Social History:  History  Alcohol Use No     History  Drug use: Unknown    Social History   Social History  . Marital status: Single    Spouse name: N/A  . Number of children: N/A  . Years of education: N/A   Social History Main Topics  . Smoking status: Never Smoker  . Smokeless tobacco: Never Used  . Alcohol use No  . Drug use: Unknown  . Sexual activity: Not Asked   Other Topics Concern  . None   Social History Narrative  . None   Hospital Course: 30 y.o.male. He presents to [facility] with GPD. Patient lives with mother and siblings. He was reportedly pacing and religiously preoccupied. Patient's mother called 911/GPD for help. Patient voluntarily came to [facility] with GPD. Patient is very religious. He states, "I was told 2 weeks that I would die, God told me". Patient states that since then he has been unable to function worried about his death. Patient sts that he often hears voices of the "God and the Devil". Sts that the voices of God tell him good things and voices of the Devil  tell him to do bad things. States that the devils voices has told him to steal. He has acted on the hallucinations and stole things. He reports hearing voices for the past 5 yrs.  Odis was admitted to the [facility] for worsening symptoms Schizoaffective disorder & crisis management due to worsening psychosis. He apparently was hearing voices he described as god telling him that he will die. He was in need of mood stability. After admission assessment, his presenting symptoms were identified. The medication management targeting those symptoms were initiated. He was medicated & discharged on; Cogentin 0.5 mg for EPS, Haldol 4 mg for psychosis, Haldol Decanoate 100 mg/ml Q 28 days for psychosis, Lithium Carbonate 450 mg for mood stabilization & Trazodone 100 mg for insomnia. He presented other significant pre-existing medical problems that required treatment or monitoring. He received medication management for those health issues. He tolerated his treatment regimen without any adverse effects reported.  As [place] treatment progressed, improvement was monitored & noted by observation of his daily reports of symptom reduction. His emotional & mental status were also monitored by daily self-inventory assessment reports completed by him & the clinical staff. He was evaluated by the treatment team for mood stability and plans for continued recovery upon discharge. Dearl's motivation was an integral factor in his mood stability. He was recommended further treatment options upon discharge by referring & scheduling him an outpatient psychiatric clinic for follow-up visits & medication managment as noted below.  Upon discharge, Iban was both mentally and medically stable for discharge denying SIHI,, auditory/visual/tactile hallucinations, delusional thoughts & or paranoia. He was provided with a 7 days worth supply samples of his [facility] discharge medications. He left [facility] in no apparent distress. Transportation per  family.  Physical Findings: AIMS: Facial and Oral Movements Muscles of Facial Expression: None, normal Lips and Perioral Area: None, normal Jaw: None, normal Tongue: None, normal,Extremity Movements Upper (arms, wrists, hands, fingers): None, normal Lower (legs, knees, ankles, toes): None, normal, Trunk Movements Neck, shoulders, hips: None, normal, Overall Severity Severity of abnormal movements (highest score from questions above): None, normal Incapacitation due to abnormal movements: None, normal Patient's awareness of abnormal movements (rate only patient's report): No Awareness, Dental Status Current problems with teeth and/or dentures?: No Does patient usually wear dentures?: No  CIWA:    COWS:     Musculoskeletal: Strength & Muscle Tone: within normal limits Gait & Station: normal Patient leans: N/A  Psychiatric Specialty Exam: Physical Exam  Constitutional: He appears well-developed.  HENT:  Head: Normocephalic.  Eyes: Pupils are equal, round, and reactive to light.  Cardiovascular: Normal rate.   Respiratory: Effort normal.  GI: Soft.  Genitourinary:  Genitourinary Comments: Denies any issues in this area  Musculoskeletal: Normal range of motion.  Neurological: He is alert.  Skin: Skin is warm.    Review of Systems  Constitutional: Negative.   HENT: Negative.   Eyes: Negative.   Respiratory: Negative.   Cardiovascular: Negative.   Gastrointestinal: Negative.   Genitourinary: Negative.   Musculoskeletal: Negative.   Skin: Negative.   Neurological: Negative.   Endo/Heme/Allergies: Negative.   Psychiatric/Behavioral: Positive for depression (Stable) and hallucinations (Hx. Psychosis). Negative for memory loss, substance abuse and suicidal ideas. The patient has insomnia (Stable). The patient is not nervous/anxious.     Blood pressure 123/80, pulse 80, temperature 98 F (36.7 C), temperature source Oral, resp. rate 16, height 6' (1.829 m), weight 66.2 kg  (146 lb), SpO2 100 %.Body mass index is 19.8 kg/m.  See Md's SRA  Have you used any form of tobacco in the last 30 days? (Cigarettes, Smokeless Tobacco, Cigars, and/or Pipes): No  Has this patient used any form of tobacco in the last 30 days? (Cigarettes, Smokeless Tobacco, Cigars, and/or Pipes): No  Blood Alcohol level:  Lab Results  Component Value Date   ETH <5 01/23/2016   Metabolic Disorder Labs:  Lab Results  Component Value Date   HGBA1C 5.0 01/27/2016   MPG 97 01/27/2016   Lab Results  Component Value Date   PROLACTIN 26.6 (H) 01/27/2016   Lab Results  Component Value Date   CHOL 314 (H) 01/27/2016   TRIG 70 01/27/2016   HDL 56 01/27/2016   CHOLHDL 5.6 01/27/2016   VLDL 14 01/27/2016   LDLCALC 244 (H) 01/27/2016   See Psychiatric Specialty Exam and Suicide Risk Assessment completed by Attending Physician prior to discharge.  Discharge destination:  Home  Is patient on multiple antipsychotic therapies at discharge:  No   Has Patient had three or more failed trials of antipsychotic monotherapy by history:  No  Recommended Plan for Multiple Antipsychotic Therapies: NA    Medication List    TAKE these medications     Indication  amLODipine 5 MG tablet Commonly known as:  NORVASC Take 1 tablet (5 mg total) by mouth daily. For high blood pressure  Indication:  High Blood Pressure Disorder   benztropine 0.5 MG tablet Commonly known as:  COGENTIN Take  1 tablet (0.5 mg total) by mouth 2 (two) times daily. For prevention of drug induced tremors  Indication:  Extrapyramidal Reaction caused by Medications   haloperidol 2 MG tablet Commonly known as:  HALDOL Take 2 tablets (4 mg total) by mouth 2 (two) times daily. For mood control  Indication:  Mood control   haloperidol decanoate 100 MG/ML injection Commonly known as:  HALDOL DECANOATE Inject 0.8 mLs (80 mg total) into the muscle once. (Due 02-28-16): For mood control Start taking on:  02/28/2016   Indication:  Mood control   lithium carbonate 450 MG CR tablet Commonly known as:  ESKALITH Take 1 tablet (450 mg total) by mouth every 12 (twelve) hours. For mood stabilization  Indication:  Manic-Depression   traZODone 100 MG tablet Commonly known as:  DESYREL Take 1 tablet (100 mg total) by mouth at bedtime. For sleep  Indication:  Trouble Sleeping      [facility]   Specialty:  Behavioral Health Why:  Go to the walk-in clinic M-F between 8 and 10AM for your hospital follow up appointment Contact information: [address] [place] [address] [phone]          Follow-up recommendations:  Activity:  As tolerated Diet: As recommended by your primary care doctor. Keep all scheduled follow-up appointments as recommended.  Comments: Patient is instructed prior to discharge to: Take all medications as prescribed by his/her mental healthcare provider. Report any adverse effects and or reactions from the medicines to his/her outpatient provider promptly. Patient has been instructed & cautioned: To not engage in alcohol and or illegal drug use while on prescription medicines. In the event of worsening symptoms, patient is instructed to call the crisis hotline, 911 and or go to the nearest ED for appropriate evaluation and treatment of symptoms. To follow-up with his/her primary care provider for your other medical issues, concerns and or health care needs.   Signed: Sanjuana KavaNwoko, Journey [place] I, NP, PMHNP, FNP-BC. 01/31/2016, 11:20 AM

## 2016-02-06 ENCOUNTER — Encounter (HOSPITAL_COMMUNITY): Payer: Self-pay | Admitting: Emergency Medicine

## 2016-02-06 ENCOUNTER — Emergency Department (HOSPITAL_COMMUNITY)
Admission: EM | Admit: 2016-02-06 | Discharge: 2016-02-06 | Disposition: A | Discharge status: Home / Self Care | Payer: Medicaid Other | Attending: Emergency Medicine | Admitting: Emergency Medicine

## 2016-02-06 DIAGNOSIS — T424X1A Poisoning by benzodiazepines, accidental (unintentional), initial encounter: Secondary | ICD-10-CM | POA: Insufficient documentation

## 2016-02-06 DIAGNOSIS — Z79899 Other long term (current) drug therapy: Secondary | ICD-10-CM | POA: Diagnosis not present

## 2016-02-06 DIAGNOSIS — T43591A Poisoning by other antipsychotics and neuroleptics, accidental (unintentional), initial encounter: Secondary | ICD-10-CM | POA: Insufficient documentation

## 2016-02-06 DIAGNOSIS — Z036 Encounter for observation for suspected toxic effect from ingested substance ruled out: Secondary | ICD-10-CM | POA: Diagnosis present

## 2016-02-06 DIAGNOSIS — F250 Schizoaffective disorder, bipolar type: Secondary | ICD-10-CM | POA: Diagnosis present

## 2016-02-06 LAB — COMPREHENSIVE METABOLIC PANEL
ALT: 16 U/L — ABNORMAL LOW (ref 17–63)
ANION GAP: 6 (ref 5–15)
AST: 17 U/L (ref 15–41)
Albumin: 3.9 g/dL (ref 3.5–5.0)
Alkaline Phosphatase: 46 U/L (ref 38–126)
BILIRUBIN TOTAL: 0.4 mg/dL (ref 0.3–1.2)
BUN: 14 mg/dL (ref 6–20)
CO2: 26 mmol/L (ref 22–32)
Calcium: 8.9 mg/dL (ref 8.9–10.3)
Chloride: 107 mmol/L (ref 101–111)
Creatinine, Ser: 1.08 mg/dL (ref 0.61–1.24)
Glucose, Bld: 98 mg/dL (ref 65–99)
POTASSIUM: 4 mmol/L (ref 3.5–5.1)
Sodium: 139 mmol/L (ref 135–145)
TOTAL PROTEIN: 6.7 g/dL (ref 6.5–8.1)

## 2016-02-06 LAB — RAPID URINE DRUG SCREEN, HOSP PERFORMED
Amphetamines: NOT DETECTED
BENZODIAZEPINES: NOT DETECTED
Barbiturates: NOT DETECTED
Cocaine: NOT DETECTED
Opiates: NOT DETECTED
Tetrahydrocannabinol: NOT DETECTED

## 2016-02-06 LAB — SALICYLATE LEVEL

## 2016-02-06 LAB — CBC
HCT: 40.6 % (ref 39.0–52.0)
Hemoglobin: 13.6 g/dL (ref 13.0–17.0)
MCH: 27.1 pg (ref 26.0–34.0)
MCHC: 33.5 g/dL (ref 30.0–36.0)
MCV: 80.9 fL (ref 78.0–100.0)
PLATELETS: 201 10*3/uL (ref 150–400)
RBC: 5.02 MIL/uL (ref 4.22–5.81)
RDW: 14.2 % (ref 11.5–15.5)
WBC: 5.7 10*3/uL (ref 4.0–10.5)

## 2016-02-06 LAB — ETHANOL

## 2016-02-06 LAB — ACETAMINOPHEN LEVEL: Acetaminophen (Tylenol), Serum: <10 ug/mL — ABNORMAL LOW (ref 10–30)

## 2016-02-06 NOTE — BH Assessment (Addendum)
Tele Assessment Note   David Novak is an 30 y.o. male.  -Clinician reviewed note by David AnisShari Upstill, PA.  Patient came in by EMS with suspected overdose.  Patient is unsure of what he took.  He says, "I took two pills earlier and three later."  When asked what the medications were he says "not really sure."  Patient says that mother usually puts out medication for him.  Patient is saying that mother may have been intoxicated when she put his mediations out.  Patient says "I should have been more aware."  EMS was called when patient "passed out."  Patient is denying current SI.  He has made a suicide attempt in May of this year by jumping from a 2 story building.  He [facility] to [facility] after that.  Pt denies any HI or current A/V hallucinations.  Patient has had recent hallucinations of hearing God and the Devil and acting on hearing their voices.  Devil told him to do bad things like steal so he would do so.  Denies visual hallucinations.  Patient has no outpatient care at this time.  [facility] and [facility].  -Clinician discussed patient care with David SievertSpencer Simon, PA who recommends an AM psych eval for patient.  Diagnosis: MDD recurrent, severe w/ psychotic features  Past Medical History:  Past Medical History:  Diagnosis Date  . Depression   . [facility](HCC)     History reviewed. No pertinent surgical history.  Family History: History reviewed. No pertinent family history.  Social History:  reports that he has never smoked. He has never used smokeless tobacco. He reports that he does not drink alcohol. His drug history is not on file.  Additional Social History:  Alcohol / Drug Use Pain Medications: See recent discharge medications Prescriptions: See recent discharge medications Over the Counter: N/A History of alcohol / drug use?: No history of alcohol / drug abuse (Denies)  CIWA: CIWA-Ar BP: 101/66 Pulse Rate: 60 COWS:     PATIENT STRENGTHS: (choose at least two) Average or above average intelligence Communication skills Supportive family/friends  Allergies: No Known Allergies  Home Medications:  (Not in a hospital admission)  OB/GYN Status:  No LMP for male patient.  General Assessment Data [facility] of [facility]: [facility] TTS Assessment: In system Is this a Tele or Face-to-Face Assessment?: Face-to-Face Is this an Initial Assessment or a Re-assessment for this encounter?: Initial Assessment Marital status: Single Is patient pregnant?: No Pregnancy Status: No Living Arrangements: Parent (Mother, 2 sisters & a nephew) Can pt return to current living arrangement?: Yes Admission Status: Voluntary Is patient capable of signing voluntary admission?: Yes Referral Source: Self/Family/Friend (Mother called EMS after patient "passed out.") Insurance type: sp     Crisis Care Plan Living Arrangements: Parent (Mother, 2 sisters & a nephew) Name of Psychiatrist: None Name of Therapist: None  Education Status Is patient currently in school?: No Highest grade of school patient has completed: 9th grade  Risk to self with the past 6 months Suicidal Ideation: No Has patient been a risk to self within the past 6 months prior to admission? : Yes Suicidal Intent: No Has patient had any suicidal intent within the past 6 months prior to admission? : Yes Is patient at risk for suicide?: No Suicidal Plan?: No Has patient had any suicidal plan within the past 6 months prior to admission? : No Access to Means: No What has been  your use of drugs/alcohol within the last 12 months?: Pt denies Previous Attempts/Gestures: Yes How many times?: 1 (In May of 2017 jumped from a 2 story building) Other Self Harm Risks: Pt denies Triggers for Past Attempts: Other (Comment) (Ongoing depression) Intentional Self Injurious Behavior: None Family Suicide History: No Recent stressful life event(s): Other (Comment)  (Psychosis in recent past.  Inpatient care) Persecutory voices/beliefs?: No Depression: Yes Depression Symptoms: Despondent, Fatigue, Guilt Substance abuse history and/or treatment for substance abuse?: No Suicide prevention information given to non-admitted patients: Not applicable  Risk to Others within the past 6 months Homicidal Ideation: No Does patient have any lifetime risk of violence toward others beyond the six months prior to admission? : No Thoughts of Harm to Others: No Current Homicidal Intent: No Current Homicidal Plan: No Access to Homicidal Means: No Identified Victim: N/A History of harm to others?: No Assessment of Violence: None Noted Violent Behavior Description: Pt calm, polite Does patient have access to weapons?: No Criminal Charges Pending?: No Does patient have a court date: No Is patient on probation?: No  Psychosis Hallucinations: Auditory (Hx of hearing Jesus & the Devil) Delusions: None noted  Mental Status Report Appearance/Hygiene: Body odor, Poor hygiene, In scrubs Eye Contact: Poor Motor Activity: Freedom of movement, Unremarkable Speech: Logical/coherent, Slow, Soft Level of Consciousness: Quiet/awake Mood: Sad Affect: Depressed Anxiety Level: None Thought Processes: Coherent, Relevant Judgement: Unimpaired Orientation: Person, Place, Time, Situation Obsessive Compulsive Thoughts/Behaviors: None  Cognitive Functioning Concentration: Decreased Memory: Recent Intact, Remote Intact IQ: Average Insight: Fair Impulse Control: Poor Appetite: Fair Weight Loss: 0 Weight Gain: 0 Sleep: Decreased Total Hours of Sleep:  (<6h/D) Vegetative Symptoms: None  ADLScreening [facility] Assessment Services) Patient's cognitive ability adequate to safely complete daily activities?: Yes Patient able to express need for assistance with ADLs?: Yes Independently performs ADLs?: Yes (appropriate for developmental age)  Prior Inpatient Therapy Prior  Inpatient Therapy: Yes Prior Therapy Dates: May 2017 & August 2017 [facility] Reason for Treatment: psychosis, SI  Prior Outpatient Therapy Prior Outpatient Therapy: No Prior Therapy Dates: None Prior Therapy Facilty/Provider(s): N/A Reason for Treatment: N/A Does patient have an ACCT team?: No Does patient have Intensive In-House Services?  : No Does patient have [facility] services? : No Does patient have P4CC services?: No  ADL Screening (condition at time of admission) Patient's cognitive ability adequate to safely complete daily activities?: Yes Is the patient deaf or have difficulty hearing?: No Does the patient have difficulty seeing, even when wearing glasses/contacts?: No Does the patient have difficulty concentrating, remembering, or making decisions?: No Patient able to express need for assistance with ADLs?: Yes Does the patient have difficulty dressing or bathing?: No Independently performs ADLs?: Yes (appropriate for developmental age) Does the patient have difficulty walking or climbing stairs?: No Weakness of Legs: None Weakness of Arms/Hands: None       Abuse/Neglect Assessment (Assessment to be complete while patient is alone) Physical Abuse: Denies Verbal Abuse: Denies Sexual Abuse: Denies Exploitation of patient/patient's resources: Denies Self-Neglect: Denies     Merchant navy officerAdvance Directives (For Healthcare) Does patient have an advance directive?: No Would patient like information on creating an advanced directive?: No - patient declined information    Additional Information 1:1 In Past 12 Months?: No CIRT Risk: No Elopement Risk: No Does patient have medical clearance?: Yes     Disposition:  Disposition Initial Assessment Completed for this Encounter: Yes Disposition of Patient: Other dispositions Type of inpatient treatment program: Adult Other disposition(s):  Other (Comment) (To be reviewed with PA)  David Novak 02/06/2016 6:26 AM

## 2016-02-06 NOTE — ED Notes (Signed)
Requested patient to urinate. 

## 2016-02-06 NOTE — ED Notes (Signed)
Spoke with TTS about patient being up for discharge but no AVS has been printed yet.  David Novak is needing to speak with patient's mother before patient can be discharged.  RN waiting for further discharge instructions from TTS.

## 2016-02-06 NOTE — Progress Notes (Signed)
Patient seen today and reports he was upset his mother was after him to take his medications.  He claims she gave him too many but should have stopped her.  Denies any suicidal ideations or homicidal ideations, hallucinations, and alcohol/drug abuse.  Stable for discharge back to his mother who he lives with.  David Novak, PMH-NP

## 2016-02-06 NOTE — Discharge Instructions (Signed)
For your ongoing mental health needs, you are advised to follow up with [facility].  If you do not currently have an appointment, new and returning patients are seen at their walk-in clinic.  Walk-in hours are Monday - Friday from 8:00 am - 3:00 pm.  Walk-in patients are seen on a first come, first served basis.  Try to arrive as early as possible for he best chance of being seen the same day: ° °     [facility] °     [address] °     [place], [place] [address] °     [phone] °

## 2016-02-06 NOTE — ED Notes (Signed)
Bed: [id number] Expected date:  Expected time:  Means of arrival:  Comments: EMS 30 yo male OD

## 2016-02-06 NOTE — ED Triage Notes (Signed)
Patient family called EMS stating he overdosed but do not what he overdose on. Patient states he was given the medication by his mom and described the pills as Risperadal and temazepam. Patient denies SI or HI. Patient was her recently for SI.

## 2016-02-06 NOTE — ED Notes (Signed)
PeR Tom with TTS, patient's mother will be coming to get patient around noon, patient has resources set up to follow up with out patient facility in discharge paperwork.

## 2016-02-06 NOTE — ED Provider Notes (Signed)
[facility]Y DEPT Provider Note   CSN: [id number] Arrival date & time: 02/06/16  0056     History   Chief Complaint Chief Complaint  Patient presents with  . Ingestion    HPI David Novak is a 30 y.o. male.  Patient with history of depression, suicidal attempt, schizoaffective disorder presents by EMS, per report, called by patient's mother with concern for overdose of medications. Mother is not in the ED. The patient reports he took "2 of one medicine and 3 of the other medicine", he states are his regularly prescribed medications. He denies intentional overdose but is vague on why took more than prescribed. When discussing that taking medication is an intentional act, he states he did not remember why he took more. He is awake and alert.   The history is provided by the patient. No language interpreter was used.  Ingestion     Past Medical History:  Diagnosis Date  . Depression   . Suicide attempt [facility](HCC)     Patient Active Problem List   Diagnosis Date Noted  . Schizoaffective disorder, bipolar type (HCC) 01/27/2016    History reviewed. No pertinent surgical history.     Home Medications    Prior to Admission medications   Medication Sig Start Date End Date Taking? Authorizing Provider  amLODipine (NORVASC) 5 MG tablet Take 1 tablet (5 mg total) by mouth daily. For high blood pressure 01/31/16  Yes Sanjuana KavaAgnes I Nwoko, NP  benztropine (COGENTIN) 0.5 MG tablet Take 1 tablet (0.5 mg total) by mouth 2 (two) times daily. For prevention of drug induced tremors 01/31/16  Yes Sanjuana KavaAgnes I Nwoko, NP  haloperidol (HALDOL) 2 MG tablet Take 2 tablets (4 mg total) by mouth 2 (two) times daily. For mood control 01/31/16  Yes Sanjuana KavaAgnes I Nwoko, NP  haloperidol decanoate (HALDOL DECANOATE) 100 MG/ML injection Inject 0.8 mLs (80 mg total) into the muscle once. (Due 02-28-16): For mood control 02/28/16 02/28/16 Yes Sanjuana KavaAgnes I Nwoko, NP  lithium carbonate (ESKALITH) 450 MG CR tablet Take 1 tablet (450 mg  total) by mouth every 12 (twelve) hours. For mood stabilization 01/31/16  Yes Sanjuana KavaAgnes I Nwoko, NP  traZODone (DESYREL) 100 MG tablet Take 1 tablet (100 mg total) by mouth at bedtime. For sleep 01/31/16  Yes Sanjuana KavaAgnes I Nwoko, NP    Family History History reviewed. No pertinent family history.  Social History Social History  Substance Use Topics  . Smoking status: Never Smoker  . Smokeless tobacco: Never Used  . Alcohol use No     Allergies   Review of patient's allergies indicates no known allergies.   Review of Systems Review of Systems  Constitutional: Negative for chills and fever.  HENT: Negative.   Respiratory: Negative.   Cardiovascular: Negative.   Gastrointestinal: Negative.   Musculoskeletal: Negative.   Skin: Negative.   Neurological: Negative.   Psychiatric/Behavioral: Negative for suicidal ideas.     Physical Exam Updated Vital Signs BP 105/66   Pulse 65   Temp 97.9 F (36.6 C) (Oral)   Resp 14   Ht 6' (1.829 m)   Wt 66.2 kg   SpO2 98%   BMI 19.80 kg/m   Physical Exam  Constitutional: He appears well-developed and well-nourished.  HENT:  Head: Normocephalic.  Neck: Normal range of motion. Neck supple.  Cardiovascular: Normal rate and regular rhythm.   Pulmonary/Chest: Effort normal and breath sounds normal.  Abdominal: Soft. Bowel sounds are normal. There is no tenderness. There is no rebound and no guarding.  Musculoskeletal: Normal range of motion.  Neurological: He is alert. No cranial nerve deficit.  Skin: Skin is warm and dry. No rash noted.  Psychiatric: His speech is delayed. He is slowed.     ED Treatments / Results  Labs (all labs ordered are listed, but only abnormal results are displayed) Labs Reviewed  COMPREHENSIVE METABOLIC PANEL - Abnormal; Notable for the following:       Result Value   ALT 16 (*)    All other components within normal limits  ACETAMINOPHEN LEVEL - Abnormal; Notable for the following:    Acetaminophen  (Tylenol), Serum <10 (*)    All other components within normal limits  ETHANOL  SALICYLATE LEVEL  CBC  URINE RAPID DRUG SCREEN, HOSP PERFORMED    EKG  EKG Interpretation None       Radiology No results found.  Procedures Procedures (including critical care time)  Medications Ordered in ED Medications - No data to display   Initial Impression / Assessment and Plan / ED Course  I have reviewed the triage vital signs and the nursing notes.  Pertinent labs & imaging results that were available during my care of the patient were reviewed by me and considered in my medical decision making (see chart for details).  Clinical Course    Patient with a history of suicide attempt presents with admission of taking more than prescribed of his medications. He denies SI, but cannot state why he took more than he knows he should. With history of suicide attempt, will have TTS consultation to determine safety for discharge.   Final Clinical Impressions(s) / ED Diagnoses   Final diagnoses:  None   1. Overdose.  New Prescriptions New Prescriptions   No medications on file     Elpidio AnisShari Noam Franzen, Cordelia Poche-C 02/06/16 0535    Paula LibraJohn Molpus, MD 02/06/16 [phone]

## 2016-02-06 NOTE — [facility]HH Suicide Risk Assessment (Signed)
Suicide Risk Assessment  Discharge Assessment   [facility] Discharge Suicide Risk Assessment   Principal Problem: Schizoaffective disorder, bipolar type [facility](HCC) Discharge Diagnoses:  Patient Active Problem List   Diagnosis Date Noted  . Schizoaffective disorder, bipolar type (HCC) [F25.0] 01/27/2016    Priority: High    Total Time spent with patient: 45 minutes  Musculoskeletal: Strength & Muscle Tone: within normal limits Gait & Station: normal Patient leans: N/A  Psychiatric Specialty Exam:   Blood pressure 109/74, pulse (!) 59, temperature 97.9 F (36.6 C), temperature source Oral, resp. rate 18, height 6' (1.829 m), weight 66.2 kg (146 lb), SpO2 98 %.Body mass index is 19.8 kg/m.  General Appearance: Casual  Eye Contact::  Good  Speech:  Normal Rate409  Volume:  Normal  Mood:  Euthymic  Affect:  Blunt  Thought Process:  Coherent and Descriptions of Associations: Intact  Orientation:  Full (Time, Place, and Person)  Thought Content:  WDL  Suicidal Thoughts:  No  Homicidal Thoughts:  No  Memory:  Immediate;   Good Recent;   Good Remote;   Good  Judgement:  Fair  Insight:  Good  Psychomotor Activity:  Normal  Concentration:  Good  Recall:  Good  Fund of Knowledge:Good  Language: Good  Akathisia:  No  Handed:  Right  AIMS (if indicated):     Assets:  Housing Leisure Time Physical Health Resilience Social Support  Sleep:     Cognition: WNL  ADL's:  Intact   Mental Status Per Nursing Assessment::   On Admission:   unintentional overdose  Demographic Factors:  Male  Loss Factors: NA  Historical Factors: NA  Risk Reduction Factors:   Sense of responsibility to family, Living with another person, especially a relative, Positive social support and Positive therapeutic relationship  Continued Clinical Symptoms:  None  Cognitive Features That Contribute To Risk:  None    Suicide Risk:  Minimal: No identifiable suicidal ideation.  Patients presenting with  no risk factors but with morbid ruminations; may be classified as minimal risk based on the severity of the depressive symptoms    Plan Of Care/Follow-up recommendations:  Activity:  as tolerated Diet:  heart healthy diet  LORD, JAMISON, NP 02/06/2016, 10:06 AM

## 2016-02-06 NOTE — BH Assessment (Signed)
BHH Assessment Progress Note  Per Nehemiah MassedFernando Cobos, MD, this pt does not require psychiatric hospitalization at this time.  [facility].  At the request of Dr Jama Flavorsobos, this writer called pt's mother, David Novak [phone]), to notify her.  Calls were placed at 10:30 and at 10:56; both rolled to voice mail and messages were left.  At 11:00 she calls back and I inform her that pt is ready for discharge with recommendation to [facility].  She agrees to pick pt up at 12:00.  Discharge instructions also advise pt to [facility].  Pt's nurse has been notified.  Doylene Canninghomas Stepen Prins, MA Triage Specialist [phone]

## 2016-03-06 ENCOUNTER — Ambulatory Visit: Payer: Self-pay | Admitting: Internal Medicine

## 2016-03-16 ENCOUNTER — Encounter: Payer: Self-pay | Admitting: Internal Medicine

## 2016-03-16 ENCOUNTER — Ambulatory Visit (INDEPENDENT_AMBULATORY_CARE_PROVIDER_SITE_OTHER): Payer: Medicaid Other | Admitting: Internal Medicine

## 2016-03-16 VITALS — BP 129/77 | HR 74 | Temp 98.1°F | Ht 72.0 in | Wt 163.0 lb

## 2016-03-16 DIAGNOSIS — F250 Schizoaffective disorder, bipolar type: Secondary | ICD-10-CM | POA: Diagnosis not present

## 2016-03-16 DIAGNOSIS — Z114 Encounter for screening for human immunodeficiency virus [HIV]: Secondary | ICD-10-CM | POA: Diagnosis not present

## 2016-03-16 DIAGNOSIS — E785 Hyperlipidemia, unspecified: Secondary | ICD-10-CM | POA: Diagnosis present

## 2016-03-16 LAB — LIPID PANEL
CHOL/HDL RATIO: 5.3 ratio — AB (ref <=5.0)
Cholesterol: 292 mg/dL — ABNORMAL HIGH (ref 125–200)
HDL: 55 mg/dL (ref >=40)
LDL CALC: 220 mg/dL — AB (ref <130)
TRIGLYCERIDES: 87 mg/dL (ref <150)
VLDL: 17 mg/dL (ref <30)

## 2016-03-16 LAB — HIV ANTIBODY (ROUTINE TESTING W REFLEX): HIV 1&2 Ab, 4th Generation: NONREACTIVE

## 2016-03-16 NOTE — Patient Instructions (Signed)
Mr. David Novak,  It was nice to meet you today.   I will call you with lab results.  If you have any trouble working with [facility], please call clinic.  Best, Dr. Sampson GoonFitzgerald

## 2016-03-16 NOTE — Progress Notes (Signed)
[facility] Progress Note  Subjective:  David Novak is a 30-y/o male who presents to establish care. He is accompanied by his mother and nephew.   PMH: - Has been diagnosed with bipolar disorder with schizoaffective disorder. - Was last hospitalized for manic episode in August and was discharged on the following: cogentin 0.5 mg for EPS, Haldol 4 mg for psychosis, Haldol Decanoate 100 mg/ml Q 28 days for psychosis, Lithium Carbonate 450 mg for mood stabilization & Trazodone 100 mg for insomnia. However, patient has not been taking any of these medications, as he does not like how he feels on them. [facility]. His mother says plan is for him to get monthly haldol injections due to medication non-adherence. Has been living with his mother since the end of last year after he jumped out of a 2nd-story window when hearing voices.  - Also has HLD in setting of antipsychotic use with LDL of 244 on 01/27/16 - Had been put on amlodipine for HTN during [facility] but has not continued to take and denies history of HTN  Mood disorder: - Has trouble staying asleep - Appetite okay - Gets enjoyment from walking family's dogs - Is not taking any medications ROS: Denies SI/HI, denies auditory or visual hallucinations Other: No SOB, no CP  Surgical history: None  Family History: Mother - depression, HTN Brother - ADHD/learning disability, depression, HLD Aunts/Uncles - Depression  Social: - Living with mother, as she feels he is not safe to live alone without supervision considering recent psychiatric admissions and poor adherence to medications - Denies recreational drug or alcohol use. Smoked tobacco briefly in past. - Currently unemployed. 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  - Completed some high school  No Known Allergies  Objective: Blood pressure 129/77, pulse 74, temperature 98.1 F (36.7 C), temperature source  Oral, height 6' (1.829 m), weight 163 lb (73.9 kg), SpO2 100 %. Body mass index is 22.11 kg/m. Constitutional: Shy male with averted gaze, in NAD Cardiovascular: RRR, S1, S2, no m/r/g.  Pulmonary/Chest: Effort normal and breath sounds normal. No respiratory distress.  Abdominal: Soft. +BS, NT, ND, no rebound or guarding.  Neurological: AOx3, no focal deficits. Psychiatric: Soft speech and makes infrequent eye contact. Answers questions appropriately.  Vitals reviewed  Depression screen [facility] 2/9 03/16/2016 03/16/2016  Decreased Interest 0 0  Down, Depressed, Hopeless 3 0  PHQ - 2 Score 3 0  Altered sleeping 3 -  Tired, decreased energy 3 -  Change in appetite 2 -  Feeling bad or failure about yourself  1 -  Trouble concentrating 0 -  Moving slowly or fidgety/restless 0 -  Suicidal thoughts 0 -  PHQ-9 Score 12 -    Assessment/Plan: Schizoaffective disorder, bipolar type (HCC) - Denies delusions, SI/HI. Scoring in the moderate range for depression.  - Plans to follow-up with [facility] early next month for further management, with mother monitoring his care.  - Plans to have monthly haldol shots due to poor medication adherence. Recommended keeping a journal to document specific changes he notices on treatment.   HLD (hyperlipidemia) - In setting of antipsychotic use but does not appear patient has been on any 2nd generation antipsychotics recently. - Recheck lipid panel. If still elevated, consider starting statin therapy.   Follow-up timing depends on need for further monitoring of cholesterol.  Dani GobbleHillary Michell Giuliano, MD [facility], PGY-2

## 2016-03-18 ENCOUNTER — Encounter: Payer: Self-pay | Admitting: Internal Medicine

## 2016-03-18 DIAGNOSIS — E785 Hyperlipidemia, unspecified: Secondary | ICD-10-CM | POA: Insufficient documentation

## 2016-03-18 NOTE — Assessment & Plan Note (Signed)
-   Denies delusions, SI/HI. Scoring in the moderate range for depression.  - Plans to follow-up with [facility] early next month for further management, with mother monitoring his care.  - Plans to have monthly haldol shots due to poor medication adherence. Recommended keeping a journal to document specific changes he notices on treatment.

## 2016-03-18 NOTE — Assessment & Plan Note (Signed)
-   In setting of antipsychotic use but does not appear patient has been on any 2nd generation antipsychotics recently. - Recheck lipid panel. If still elevated, consider starting statin therapy.

## 2016-03-20 ENCOUNTER — Telehealth: Payer: Self-pay | Admitting: Internal Medicine

## 2016-03-20 DIAGNOSIS — E785 Hyperlipidemia, unspecified: Secondary | ICD-10-CM

## 2016-03-20 MED ORDER — ATORVASTATIN CALCIUM 20 MG PO TABS: 20.0000 mg | ORAL_TABLET | Freq: Every evening | ORAL | 3 refills | Status: DC

## 2016-03-20 NOTE — Telephone Encounter (Signed)
Called patient to inform him of elevated cholesterol. LDL > 190. This is without taking psychiatric medications for at least a month. Prescribed atorvastatin 20 mg nightly. Counseled patient to stop taking if he develops muscle cramping. Asked him to return for follow-up in a few months to check response to medication.

## 2016-04-27 ENCOUNTER — Other Ambulatory Visit: Payer: Self-pay | Admitting: Internal Medicine

## 2016-04-27 DIAGNOSIS — E785 Hyperlipidemia, unspecified: Secondary | ICD-10-CM

## 2016-04-27 MED ORDER — ATORVASTATIN CALCIUM 20 MG PO TABS: 20.0000 mg | ORAL_TABLET | Freq: Every day | ORAL | 3 refills | Status: AC

## 2016-04-27 NOTE — Telephone Encounter (Signed)
Patient's mother noted at brother's appointment today that statin prescription was not at pharmacy. Requested I resend Rx. [facility] [address]

## 2016-05-04 ENCOUNTER — Other Ambulatory Visit: Payer: Self-pay | Admitting: Internal Medicine

## 2016-05-04 NOTE — Telephone Encounter (Signed)
This was done on 04/27/2016 by PCP  L. Leward Quanucatte, RN, BSN

## 2016-05-04 NOTE — Telephone Encounter (Signed)
Pt needs a refill on lipitor. Pt [facility] [address]. Please advise. Thanks! ep

## 2016-05-06 ENCOUNTER — Encounter (HOSPITAL_COMMUNITY): Payer: Self-pay | Admitting: *Deleted

## 2016-05-06 DIAGNOSIS — M2669 Other specified disorders of temporomandibular joint: Secondary | ICD-10-CM

## 2016-05-06 DIAGNOSIS — Z87891 Personal history of nicotine dependence: Secondary | ICD-10-CM | POA: Diagnosis not present

## 2016-05-06 DIAGNOSIS — T434X5D Adverse effect of butyrophenone and thiothixene neuroleptics, subsequent encounter: Secondary | ICD-10-CM | POA: Diagnosis not present

## 2016-05-06 MED ORDER — BENZTROPINE MESYLATE 1 MG/ML IJ SOLN
1.0000 mg | Freq: Once | INTRAMUSCULAR | Status: AC
  Administered 2016-05-06: 1 mg via INTRAMUSCULAR
  Filled 2016-05-06: qty 2

## 2016-05-06 NOTE — ED Triage Notes (Signed)
Patient presents with jaw locked

## 2016-05-06 NOTE — ED Notes (Signed)
Spoke with Mother a was told that he received a shot of Hald[facility] 2mg  at [facility].  Dr Freida BusmanAllen ordered Cogentin.  To call sister at [phone] when discharged

## 2016-05-07 ENCOUNTER — Emergency Department (HOSPITAL_COMMUNITY)
Admission: EM | Admit: 2016-05-07 | Discharge: 2016-05-08 | Disposition: A | Discharge status: Home / Self Care | Payer: Medicaid Other | Attending: Emergency Medicine | Admitting: Emergency Medicine

## 2016-05-07 ENCOUNTER — Telehealth: Payer: Self-pay | Admitting: *Deleted

## 2016-05-07 ENCOUNTER — Emergency Department (HOSPITAL_COMMUNITY)
Admission: EM | Admit: 2016-05-07 | Discharge: 2016-05-07 | Disposition: A | Discharge status: Home / Self Care | Payer: Medicaid Other | Source: Home / Self Care | Attending: Emergency Medicine | Admitting: Emergency Medicine

## 2016-05-07 ENCOUNTER — Encounter (HOSPITAL_COMMUNITY): Payer: Self-pay | Admitting: Emergency Medicine

## 2016-05-07 DIAGNOSIS — M2669 Other specified disorders of temporomandibular joint: Secondary | ICD-10-CM | POA: Insufficient documentation

## 2016-05-07 DIAGNOSIS — Z87891 Personal history of nicotine dependence: Secondary | ICD-10-CM | POA: Insufficient documentation

## 2016-05-07 DIAGNOSIS — T50905D Adverse effect of unspecified drugs, medicaments and biological substances, subsequent encounter: Secondary | ICD-10-CM

## 2016-05-07 DIAGNOSIS — T434X5D Adverse effect of butyrophenone and thiothixene neuroleptics, subsequent encounter: Secondary | ICD-10-CM | POA: Insufficient documentation

## 2016-05-07 DIAGNOSIS — A35 Other tetanus: Secondary | ICD-10-CM

## 2016-05-07 MED ORDER — DIPHENHYDRAMINE HCL 50 MG/ML IJ SOLN
50.0000 mg | Freq: Once | INTRAMUSCULAR | Status: AC
  Administered 2016-05-07: 50 mg via INTRAMUSCULAR
  Filled 2016-05-07: qty 1

## 2016-05-07 NOTE — ED Notes (Signed)
Pt ambulatory, muscle twitching has stopped

## 2016-05-07 NOTE — ED Notes (Signed)
Pt is able to open his jaw at this time.

## 2016-05-07 NOTE — Telephone Encounter (Signed)
Pt mom called stating pt was not given discharge paperwork and would like it mailed to his billing address as she and he does not drive.

## 2016-05-07 NOTE — ED Triage Notes (Signed)
Pt states "earlier today my mouth was stuck to the right and I couldn't move it." pt states he had a hard time opening his jaw on the right side. Pt states "i can open it now but sometimes it will come back". Denies injury. Pt states he went to see a doctor and they said it was a reaction to a shot he got in his left arm. Pt unclear what shot it was.

## 2016-05-07 NOTE — Discharge Instructions (Signed)
RESOURCE GUIDE ° °Chronic Pain Problems: °Contact [facility]  297-2271 °Patients need to be referred by their primary care doctor. ° °Insufficient Money for Medicine: °Contact United Way:  call "211" or [facility] 271-5999. ° °No Primary Care Doctor: °Call [facility]  832-8000 - can help you locate a primary care doctor that  accepts your insurance, provides certain services, etc. °Physician Referral Service- 1-800-533-3463 ° °Agencies that provide inexpensive medical care: °[facility]  832-8035 °[facility]  832-7272 °[facility]  [phone] °[facility]  832-4777 °Planned Parenthood  373-0678 °[facility]  272-1050 ° °Medicaid-accepting [place] Providers: °[facility]- [address] ° [phone], Mon-Fri 9am-7pm, Sat 9am-1pm °[facility]- [address], [address] ° [phone] °[facility]- [address] ° [phone] °[facility]- [address] [place] [address] ° [phone] °[facility]- [address], [phone] ° Only accepts [place] Access Medicaid patients after they have their name  applied to their card ° °Self Pay (no insurance) in [place]: °Sickle Cell Patients: Dr Eric Dean, [facility] ° [address], [phone] °[facility]- [address] ° [phone] °      -      [facility]- [address] °      -     [facility]- see information above (Speak to Pam H if you do not have insurance) °      -  [facility]- [address], [phone] °      -  [facility] [place]- [address],  [phone] °      -  [facility]- [address] [place] [address], [phone] °      -  Dr Osei-Bonsu-  [address], [place], [phone] °      -  [facility]- [address], [phone] °      -   [facility]- [address] [place] [address], [phone], also [address], [phone] °      -    [facility]- [address], [phone], 1st & 3rd Saturday   every month, 10am-1pm ° °1) Find a Doctor and Pay Out of Pocket °Although you won't have to find out who is covered by your insurance plan, it is a good idea to ask around and get recommendations. You will then need to call the office and see if the doctor you have chosen will accept you as a new patient and what types of options they offer for patients who are self-pay. Some doctors offer discounts or will set up payment plans for their patients who do not have insurance, but you will need to ask so you aren't surprised when you get to your appointment. ° °2) Contact Your Local Health Department °Not all health departments have doctors that can see patients for sick visits, but many do, so it is worth a call to see if yours does. If you don't know where your local health department is, you can check in your phone book. The CDC also has a tool to help you locate your state's health department, and many state websites also have listings of all of their local health departments. ° °3) Find a Walk-in Clinic °If   your illness is not likely to be very severe or complicated, you may want to try a walk in clinic. These are popping up all over the country in pharmacies, drugstores, and shopping centers. They're usually staffed by nurse practitioners or physician assistants that have been trained to treat common illnesses and complaints. They're usually fairly quick and inexpensive. However, if you have serious medical issues or chronic medical problems, these are probably not your best option ° °STD Testing °[place] [facility] [place], [facility], [address], [place], phone [phone] or 1-877-539-9860.  Monday - Friday, call for an appointment. °[place] [facility] [place], STD  Clinic, [address], [place], phone [phone] or 1-877-539-9860.  Monday - Friday, call for an appointment. ° °Abuse/Neglect: °[place] Child Abuse Hotline (336) 641-3795 °[place] Child Abuse Hotline 800-378-5315 (After Hours) ° °Emergency Shelter:  [facility] (336) 271-5985 ° °Maternity Homes: °[facility] (336) 275-9566 °[facility] (704) 372-4663 ° °MRSA Hotline #:   832-7006 ° °[place] Resources ° °[facility] [place]  [employer] [place] [facility] °[address]                 [address]         [address]  °[place]                                               [place]                              [place] °Phone:  349-3220                                  Phone:  342-7768                   Phone:  342-8140 ° °[facility], 342-8316 °[place] [facility] - [facility]- 1-888-581-9988 °      -     [facility] in [place], [address],                                  336-349-4454, Insurance ° °[place] Child Abuse Hotline °(336) 342-1394 or (336) 342-3537 (After Hours) ° ° °Behavioral Health Services ° °Substance Abuse Resources: °[facility]  336-882-2125 °[facility] 336-784-9470 °[facility] 336-285-9073 °[facility] 336-845-3988 °Residential & Outpatient Substance Abuse Program  800-659-3381 ° °Psychological Services: °[facility]  832-9600 °[facility]  378-7881 °[facility], [address], [place], ACCESS LINE: 1-800-853-5163 or 336-641-4981, [email or web address].[email or web address] ° °Dental Assistance ° °If unable to pay or uninsured, contact:  [facility] or [facility] to become qualified for the adult dental clinic. ° °Patients with Medicaid: [facility] °[address], 632-0744 °[address], 510-2600 ° °If unable to pay, or uninsured, contact [facility] (271-5999) or [place] [facility] (641-3152 in [place], 842-7733 in [place]) to become qualified for the adult dental clinic ° °Other Low-Cost   Community Dental Services: °[facility]- [address], [place], [place], [address], 723-1848, [address]. [address], 2nd and 4th Thursday of the month at 6:30am.  10 clients each day by appointment, can sometimes see walk-in patients if someone does not show for an appointment. °[facility]- [address], [place], [place], [address], 723-7904 °[facility]- [address], [place], [place], [address], 631-2330 °[facility]- 342-8273 °[facility]- 703-3100 °[facility]- 570-6415 ° °Please make every effort to establish with a primary care physician for routine medical care ° °Adult Health Services  °The [place] [facility] provides a wide range of adult health services. Some of these services are designed to address the healthcare needs of all [place] residents and all services are designed to meet the needs of uninsured/underinsured low income residents. Some services are available to any resident of [place], call [phone] for details. °] °[facility], a new medical clinic for adults, is now open. For more information about the Center and its services please call [phone]. °For information on our [facility] services, click here. ° °For more information on any of the following Department of Public Health programs, including hours of service, click on the highlighted link. ° °SERVICES FOR WOMEN (Adults and Teens) °Family Planning Services provide a full range of birth control options plus education and counseling. New patient visit and annual return visits include a complete  examination, pap test as indicated, and other laboratory as indicated. Included is our Regional Vasectomy Program for men. ° °Maternity Care is provided through pregnancy, including a six week post partum exam. Women who meet eligibility criteria for the Medicaid for Pregnant Women program, receive care free. Other women are charged on a sliding scale according to income. °Note: [facility] provides services to pregnant women who have a Medicaid card. Call [phone] for an appointment in [place] or [phone] for an appointment in [place]. ° °Primary Care for Medicaid [place] Access Women is available through the [place] [facility]. As primary care provider for the [place] Medicaid [place] Access Medicaid Managed Care program, women may designate the Women’s Health clinic as their primary care provider. ° °PLEASE CALL 641-3245 FOR AN APPOINTMENT FOR THE ABOVE SERVICES IN EITHER [place] OR [place]. Information available in English and Spanish.  ° °Childbirth Education Classes are open to the public and offered to help families prepare for the best possible childbirth experience as well as to promote lifelong health and wellness. Classes are offered throughout the year and meet on the same night once a week for five weeks. Medicaid covers the cost of the classes for the mother-to-be and her partner. For participants without Medicaid, the cost of the class series is $45.00 for the mother-to-be and her partner. Class size is limited and registration is required. For more information or to register call 336-641-4718. Baby items donated by [employer] and the [employer] [place] are given away during each class series. ° °SERVICES FOR WOMEN AND MEN °Sexually Transmitted Infection appointments, including HIV testing, are available daily (weekdays, except holidays). Call early as same-day appointments are limited. For an appointment in either [place] or [place],  call [phone]. Services are confidential and free of charge. ° °Skin Testing for Tuberculosis Please call [phone]. °Adult Immunizations are available, usually for a fee. Please call [phone] for details. ° °PLEASE CALL [phone] FOR AN APPOINTMENT FOR THE ABOVE   SERVICES IN EITHER [place] OR [place].  ° °[facility] provides up to the minute recommended vaccines for your travel destination. We also provide essential health and political information to help insure a safe and pleasurable travel experience. This program is self-sustaining, however, fees are very competitive. We are a CERTIFIED YELLOW FEVER IMMUNIZATION approved clinic site. °PLEASE CALL [phone] FOR AN APPOINTMENT IN EITHER [place] OR [place].  ° °If you have questions about the services listed above, we want to answer them! Email us at: [email or web address] °Home Visiting Services for elderly and the disabled are available to residents of [place] who are in need of care that compares to the care offered by a nursing home, have needs that can be met by the program, and have [place] Medicaid. Other short term services are available to residents 18 years and older who are unable to meet requirements for eligibility to receive services from a certified home health agency, spend the majority of time at home, and need care for six months or less. ° °PLEASE CALL [phone] OR [phone] FOR MORE INFORMATION. °Medication Assistance Program serves as a link between pharmaceutical companies and patients to provide low cost or free prescription medications. This servce is available for residents who meet certain income restrictions and have no insurance coverage. ° °PLEASE CALL 641-8030 () OR [phone] ([place]) FOR MORE INFORMATION.  °Updated Feb. 21, 2013 ° ° °

## 2016-05-07 NOTE — ED Provider Notes (Signed)
[facility]-EMER[facility]ENCY DEPT Provider Note   CSN: [id number] Arrival date & time: 05/06/16  2220   History   Chief Complaint Chief Complaint  Patient presents with  . Jaw locked    HPI David Novak is a 30 y.o. male.  HPI   Patient has past medical history of depression and suicide attempt presents to the emergency department with complaints of locked jaw. The patient had an injection of Cogentin done at behavioral health around lunchtime today and reports around 9 PM this evening is when his symptoms started. The triage nurse discussed the case with Dr. Freida BusmanAllen, the patient was awaiting he recommended a dose of Cogentin. Upon evaluation of the patient he is no longer having any symptoms and has no complaints or concerns at this time. He denies that this is ever happened to him before.  Past Medical History:  Diagnosis Date  . Depression   . Suicide attempt     Patient Active Problem List   Diagnosis Date Noted  . HLD (hyperlipidemia) 03/18/2016  . Schizoaffective disorder, bipolar type (HCC) 01/27/2016    History reviewed. No pertinent surgical history.    Home Medications    Prior to Admission medications   Medication Sig Start Date End Date Taking? Authorizing Provider  haloperidol decanoate (HALDOL DECANOATE) 100 MG/ML injection Inject 0.8 mLs (80 mg total) into the muscle once. (Due 02-28-16): For mood control Patient taking differently: Inject 80 mg into the muscle every 28 (twenty-eight) days. For mood control 02/28/16 05/07/16 Yes Sanjuana KavaAgnes I Nwoko, NP  atorvastatin (LIPITOR) 20 MG tablet Take 1 tablet (20 mg total) by mouth at bedtime. Patient not taking: Reported on 05/07/2016 04/27/16   Casey BurkittHillary Moen Fitzgerald, MD    Family History Family History  Problem Relation Age of Onset  . Depression Mother   . Learning disabilities Brother   . Depression Brother   . Hyperlipidemia Brother     Social History Social History  Substance Use Topics  . Smoking status: Former Games developermoker    . Smokeless tobacco: Never Used  . Alcohol use No     Allergies   Patient has no known allergies.   Review of Systems Review of Systems  Review of Systems All other systems negative except as documented in the HPI. All pertinent positives and negatives as reviewed in the HPI.  Physical Exam Updated Vital Signs BP 122/74   Pulse 70   Temp 97.8 F (36.6 C) (Axillary)   Resp 16   SpO2 95%   Physical Exam  Constitutional: He appears well-developed and well-nourished. No distress.  HENT:  Head: Normocephalic and atraumatic.  Patient able to talk normally and has full range of motion of his jaw joint without any pain or clenching.  Eyes: Pupils are equal, round, and reactive to light.  Neck: Normal range of motion. Neck supple.  Cardiovascular: Normal rate and regular rhythm.   Pulmonary/Chest: Effort normal.  Abdominal: Soft.  Neurological: He is alert.  Skin: Skin is warm and dry.  Nursing note and vitals reviewed.    ED Treatments / Results  Labs (all labs ordered are listed, but only abnormal results are displayed) Labs Reviewed - No data to display  EKG  EKG Interpretation None       Radiology No results found.  Procedures Procedures (including critical care time)  Medications Ordered in ED Medications  benztropine mesylate (COGENTIN) injection 1 mg (1 mg Intramuscular Given 05/06/16 2248)     Initial Impression / Assessment and Plan / ED  Course  I have reviewed the triage vital signs and the nursing notes.  Pertinent labs & imaging results that were available during my care of the patient were reviewed by me and considered in my medical decision making (see chart for details).  Clinical Course     Pt back to baseline, Cogentin resolved symptoms. Advised to call [facility] tomorrow and inform them of the medication complication.  Final Clinical Impressions(s) / ED Diagnoses   Final diagnoses:  Lockjaw    New Prescriptions Discharge Medication  List as of 05/07/2016  1:58 AM       Marlon Peliffany Lynnox Girten, PA-C 05/07/16 0317    Layla MawKristen N Ward, DO 05/07/16 [id number]

## 2016-05-07 NOTE — ED Notes (Signed)
While patient was in the waiting room, started having involuntary muscle twitching. Pt albe to speak, AAOX4, tolerating secretions and breathing easily while having muscle twitches. Dr. Effie Shywentz notified, verbal order 50 benadryl IM.

## 2016-05-07 NOTE — ED Notes (Signed)
Pt sister notified of Pt d/c, sister stated that she would come and pick pt up.

## 2016-05-08 MED ORDER — DIPHENHYDRAMINE HCL 25 MG PO TABS: 25.0000 mg | ORAL_TABLET | Freq: Two times a day (BID) | ORAL | 0 refills | Status: DC | PRN

## 2016-05-08 NOTE — Discharge Instructions (Signed)
RESOURCE GUIDE ° °Chronic Pain Problems: °Contact [facility]  [phone] °Patients need to be referred by their primary care doctor. ° °Insufficient Money for Medicine: °Contact United Way:  call "211" or [facility] 271-5999. ° °No Primary Care Doctor: °Call [facility]  832-8000 - can help you locate a primary care doctor that  accepts your insurance, provides certain services, etc. °Physician Referral Service- 1-800-533-3463 ° °Agencies that provide inexpensive medical care: °[facility]  832-8035 °[facility]  832-7272 °[facility]  [phone] °[facility] Clinic  832-4777 °Planned Parenthood  373-0678 °[facility]  272-1050 ° °Medicaid-accepting [place] Providers: °[facility]- [address] ° [phone], Mon-Fri 9am-7pm, Sat 9am-1pm °[facility]- [address], [address] ° [phone] °[facility]- [address], [address] ° [phone] °[facility]- [address] [place] [address] ° [phone] °[facility]- [address], [address], [phone] ° Only accepts [place] Access Medicaid patients after they have their name  applied to their card ° °Self Pay (no insurance) in [place]: °Sickle Cell Patients: Dr Eric Dean, [facility] ° [address], [phone] °[facility]- [address] ° [phone] °      -      [facility]- [address] °      -     [facility]- see information above (Speak to Pam H if you do not have insurance) °      -  [facility]- [address], [phone] °      -  [facility] [place]- [address],  [phone] °      -  [facility]- [address] [place] [address], [phone] °      -  Dr Osei-Bonsu-  [address], [place], [phone] °      -  [facility]- [address], [phone] °      -   [facility]- [address] [place] [address], [phone], also [address], [phone] °      -    [facility]- [address], [phone], 1st & 3rd Saturday   every month, 10am-1pm ° °1) Find a Doctor and Pay Out of Pocket °Although you won't have to find out who is covered by your insurance plan, it is a good idea to ask around and get recommendations. You will then need to call the office and see if the doctor you have chosen will accept you as a new patient and what types of options they offer for patients who are self-pay. Some doctors offer discounts or will set up payment plans for their patients who do not have insurance, but you will need to ask so you aren't surprised when you get to your appointment. ° °2) Contact Your Local Health Department °Not all health departments have doctors that can see patients for sick visits, but many do, so it is worth a call to see if yours does. If you don't know where your local health department is, you can check in your phone book. The CDC also has a tool to help you locate your state's health department, and many state websites also have listings of all of their local health departments. ° °3) Find a Walk-in Clinic °If   your illness is not likely to be very severe or complicated, you may want to try a walk in clinic. These are popping up all over the country in pharmacies, drugstores, and shopping centers. They're usually staffed by nurse practitioners or physician assistants that have been trained to treat common illnesses and complaints. They're usually fairly quick and inexpensive. However, if you have serious medical issues or chronic medical problems, these are probably not your best option ° °STD Testing °[place] [facility] [place], [facility], [address], [place], phone [phone] or 1-877-539-9860.  Monday - Friday, call for an appointment. °[place] [facility] [place], STD  Clinic, [address], [place], phone [phone] or 1-877-539-9860.  Monday - Friday, call for an appointment. ° °Abuse/Neglect: °[place] Child Abuse Hotline (336) 641-3795 °[place] Child Abuse Hotline 800-378-5315 (After Hours) ° °Emergency Shelter:  [facility] (336) 271-5985 ° °Maternity Homes: °[facility] (336) 275-9566 °[facility] (704) 372-4663 ° °MRSA Hotline #:   832-7006 ° °[place] Resources ° °[facility] [place]  [employer] [place] [facility] °[address]                 [address]         [address]  °[place]                                               [place]                              [place] °Phone:  349-3220                                  Phone:  342-7768                   Phone:  342-8140 ° °[facility], 342-8316 °[place] [facility] - [facility]- 1-888-581-9988 °      -     [facility] in [place], [address],                                  336-349-4454, Insurance ° °[place] Child Abuse Hotline °(336) 342-1394 or (336) 342-3537 (After Hours) ° ° °Behavioral Health Services ° °Substance Abuse Resources: °[facility]  336-882-2125 °[facility] 336-784-9470 °[facility] 336-285-9073 °[facility] 336-845-3988 °Residential & Outpatient Substance Abuse Program  800-659-3381 ° °Psychological Services: °[facility]  832-9600 °[facility]  378-7881 °[facility], [address], [place], ACCESS LINE: 1-800-853-5163 or 336-641-4981, [email or web address]/adult.htm ° °Dental Assistance ° °If unable to pay or uninsured, contact:  [facility] or [facility] to become qualified for the adult dental clinic. ° °Patients with Medicaid: [facility] °[address], 632-0744 °[address], [phone] ° °If unable to pay, or uninsured, contact [facility] (271-5999) or [place] [facility] (641-3152 in [place], 842-7733 in [place]) to become qualified for the adult dental clinic ° °Other Low-Cost   Community Dental Services: °[facility]- [address], [place], [place], [address], 723-1848, [address]. [address], 2nd and 4th Thursday of the month at 6:30am.  10 clients each day by appointment, can sometimes see walk-in patients if someone does not show for an appointment. °[facility]- [address], [place], [place], [address], 723-7904 °[facility]- [address], [place], [place], [address], 631-2330 °[facility]- 342-8273 °[facility]- 703-3100 °[facility]- 570-6415 ° °Please make every effort to establish with a primary care physician for routine medical care ° °Adult Health Services  °The [place] [facility] provides a wide range of adult health services. Some of these services are designed to address the healthcare needs of all [place] residents and all services are designed to meet the needs of uninsured/underinsured low income residents. Some services are available to any resident of [place], call [phone] for details. °] °[facility], a new medical clinic for adults, is now open. For more information about the Center and its services please call [phone]. °For information on our [facility] services, click here. ° °For more information on any of the following Department of Public Health programs, including hours of service, click on the highlighted link. ° °SERVICES FOR WOMEN (Adults and Teens) °Family Planning Services provide a full range of birth control options plus education and counseling. New patient visit and annual return visits include a complete  examination, pap test as indicated, and other laboratory as indicated. Included is our Regional Vasectomy Program for men. ° °Maternity Care is provided through pregnancy, including a six week post partum exam. Women who meet eligibility criteria for the Medicaid for Pregnant Women program, receive care free. Other women are charged on a sliding scale according to income. °Note: [facility] provides services to pregnant women who have a Medicaid card. Call [phone] for an appointment in [place] or [phone] for an appointment in [place]. ° °Primary Care for Medicaid [place] Access Women is available through the [place] [facility]. As primary care provider for the [place] Medicaid [place] Access Medicaid Managed Care program, women may designate the Women?s Health clinic as their primary care provider. ° °PLEASE CALL [phone] FOR AN APPOINTMENT FOR THE ABOVE SERVICES IN EITHER [place] OR [place]. Information available in English and Spanish.  ° °Childbirth Education Classes are open to the public and offered to help families prepare for the best possible childbirth experience as well as to promote lifelong health and wellness. Classes are offered throughout the year and meet on the same night once a week for five weeks. Medicaid covers the cost of the classes for the mother-to-be and her partner. For participants without Medicaid, the cost of the class series is $45.00 for the mother-to-be and her partner. Class size is limited and registration is required. For more information or to register call 336-641-4718. Baby items donated by [employer] and the [employer] are given away during each class series. ° °SERVICES FOR WOMEN AND MEN °Sexually Transmitted Infection appointments, including HIV testing, are available daily (weekdays, except holidays). Call early as same-day appointments are limited. For an appointment in either [place] or [place],  call [phone]. Services are confidential and free of charge. ° °Skin Testing for Tuberculosis Please call [phone]. °Adult Immunizations are available, usually for a fee. Please call [phone] for details. ° °PLEASE CALL [phone] FOR AN APPOINTMENT FOR THE ABOVE   SERVICES IN EITHER [place] OR [place].  ° °[facility] provides up to the minute recommended vaccines for your travel destination. We also provide essential health and political information to help insure a safe and pleasurable travel experience. This program is self-sustaining, however, fees are very competitive. We are a CERTIFIED YELLOW FEVER IMMUNIZATION approved clinic site. °PLEASE CALL [phone] FOR AN APPOINTMENT IN EITHER [place] OR [place].  ° °If you have questions about the services listed above, we want to answer them! Email us at: [email or web address] °Home Visiting Services for elderly and the disabled are available to residents of [place] who are in need of care that compares to the care offered by a nursing home, have needs that can be met by the program, and have [place] Medicaid. Other short term services are available to residents 18 years and older who are unable to meet requirements for eligibility to receive services from a certified home health agency, spend the majority of time at home, and need care for six months or less. ° °PLEASE CALL [phone] OR [phone] FOR MORE INFORMATION. °Medication Assistance Program serves as a link between pharmaceutical companies and patients to provide low cost or free prescription medications. This servce is available for residents who meet certain income restrictions and have no insurance coverage. ° °PLEASE CALL [phone] ([place]) OR [phone] ([place]) FOR MORE INFORMATION.  °Updated Feb. 21, 2013 ° ° °

## 2016-05-08 NOTE — ED Provider Notes (Signed)
[facility]-EMERGENCY DEPT Provider Note   CSN: [id number] Arrival date & time: 05/07/16  2152  History   Chief Complaint Chief Complaint  Patient presents with  . Jaw Pain  . Medication Reaction    HPI David Novak is a 30 y.o. male.  HPI   Patient had PMH of depression and suicide attempt, HLD, and schizoaffective disorder.  He was seen by myself yesterday 05/07/2016 for lockjaw after [facility]. He was given a shot of Cogentin and his symptoms resolved. He returns today because he has had intermittent lockjaw, currently resolved. He has also had episodes of twitching. Currently resolved, given a shot of 50mg  IM Benadryl in triage, symptoms resolved byt the time brought back to a room.    Past Medical History:  Diagnosis Date  . Depression   . Suicide attempt     Patient Active Problem List   Diagnosis Date Noted  . HLD (hyperlipidemia) 03/18/2016  . Schizoaffective disorder, bipolar type (HCC) 01/27/2016    History reviewed. No pertinent surgical history.     Home Medications    Prior to Admission medications   Medication Sig Start Date End Date Taking? Authorizing Provider  haloperidol decanoate (HALDOL DECANOATE) 100 MG/ML injection Inject 0.8 mLs (80 mg total) into the muscle once. (Due 02-28-16): For mood control Patient taking differently: Inject 80 mg into the muscle every 28 (twenty-eight) days. For mood control 02/28/16 05/08/16 Yes Sanjuana KavaAgnes I Nwoko, NP  atorvastatin (LIPITOR) 20 MG tablet Take 1 tablet (20 mg total) by mouth at bedtime. Patient not taking: Reported on 05/08/2016 04/27/16   Casey BurkittHillary Moen Fitzgerald, MD  diphenhydrAMINE (BENADRYL) 25 MG tablet Take 1 tablet (25 mg total) by mouth every 12 (twelve) hours as needed (twitching or locked jaw). 05/08/16   Marlon Peliffany Angelus Hoopes, PA-C    Family History Family History  Problem Relation Age of Onset  . Depression Mother   . Learning disabilities Brother   . Depression  Brother   . Hyperlipidemia Brother     Social History Social History  Substance Use Topics  . Smoking status: Former Games developermoker  . Smokeless tobacco: Never Used  . Alcohol use No     Allergies   Patient has no known allergies.   Review of Systems Review of Systems Review of Systems All other systems negative except as documented in the HPI. All pertinent positives and negatives as reviewed in the HPI.   Physical Exam Updated Vital Signs BP 126/76   Pulse 69   Temp 97.9 F (36.6 C) (Oral)   Resp 16   SpO2 97%   Physical Exam  Constitutional: He appears well-developed and well-nourished. No distress.  HENT:  Head: Normocephalic and atraumatic.  Patient able to talk normally and has full range of motion of his jaw joint without any pain or clenching.  Eyes: Pupils are equal, round, and reactive to light.  Neck: Normal range of motion. Neck supple.  Cardiovascular: Normal rate and regular rhythm.   Pulmonary/Chest: Effort normal.  Abdominal: Soft.  Neurological: He is alert. No muscle twitching Skin: Skin is warm and dry.  Nursing note and vitals reviewed.   ED Treatments / Results  Labs (all labs ordered are listed, but only abnormal results are displayed) Labs Reviewed - No data to display  EKG  EKG Interpretation None       Radiology No results found.  Procedures Procedures (including critical care time)  Medications Ordered in ED Medications  diphenhydrAMINE (BENADRYL) injection 50 mg (50 mg Intramuscular Given 05/07/16 2253)     Initial Impression / Assessment and Plan / ED Course  I have reviewed the triage vital signs and the nursing notes.  Pertinent labs & imaging results that were available during my care of the patient were reviewed by me and considered in my medical decision making (see chart for details).  Clinical Course     Pt given rx for PO Haldol but has not been taking it. He has been advised to call [facility] in the morning,  the benadryl relieved his symptoms. Rx for benadryl, discussed how important it is to be careful and not take more than prescribed- pt has hx of SI and depression so I am hesitant to give frequent and large doses. Denies current SI/HI.  Final Clinical Impressions(s) / ED Diagnoses   Final diagnoses:  Adverse effect of drug, subsequent encounter    New Prescriptions New Prescriptions   DIPHENHYDRAMINE (BENADRYL) 25 MG TABLET    Take 1 tablet (25 mg total) by mouth every 12 (twelve) hours as needed (twitching or locked jaw).     Marlon Peliffany Zerline Melchior, PA-C 05/08/16 0246    Glynn OctaveStephen Rancour, MD 05/08/16 [phone]

## 2016-12-14 ENCOUNTER — Ambulatory Visit: Payer: Medicaid Other | Admitting: Internal Medicine

## 2016-12-31 ENCOUNTER — Ambulatory Visit: Payer: Medicaid Other | Admitting: Internal Medicine

## 2017-01-03 ENCOUNTER — Emergency Department (HOSPITAL_COMMUNITY)
Admission: EM | Admit: 2017-01-03 | Discharge: 2017-01-05 | Disposition: A | Discharge status: Home / Self Care | Payer: Medicaid Other | Attending: Emergency Medicine | Admitting: Emergency Medicine

## 2017-01-03 ENCOUNTER — Encounter (HOSPITAL_COMMUNITY): Payer: Self-pay | Admitting: *Deleted

## 2017-01-03 DIAGNOSIS — F250 Schizoaffective disorder, bipolar type: Secondary | ICD-10-CM | POA: Diagnosis not present

## 2017-01-03 DIAGNOSIS — Z008 Encounter for other general examination: Secondary | ICD-10-CM | POA: Diagnosis not present

## 2017-01-03 DIAGNOSIS — R4589 Other symptoms and signs involving emotional state: Secondary | ICD-10-CM | POA: Insufficient documentation

## 2017-01-03 DIAGNOSIS — Z87891 Personal history of nicotine dependence: Secondary | ICD-10-CM | POA: Diagnosis not present

## 2017-01-03 DIAGNOSIS — Z810 Family history of intellectual disabilities: Secondary | ICD-10-CM | POA: Diagnosis not present

## 2017-01-03 DIAGNOSIS — Z818 Family history of other mental and behavioral disorders: Secondary | ICD-10-CM | POA: Diagnosis not present

## 2017-01-03 DIAGNOSIS — Z23 Encounter for immunization: Secondary | ICD-10-CM | POA: Insufficient documentation

## 2017-01-03 DIAGNOSIS — R4689 Other symptoms and signs involving appearance and behavior: Secondary | ICD-10-CM

## 2017-01-03 LAB — CBC WITH DIFFERENTIAL/PLATELET
Basophils Absolute: 0 10*3/uL (ref 0.0–0.1)
Basophils Relative: 0 %
EOS ABS: 0.1 10*3/uL (ref 0.0–0.7)
Eosinophils Relative: 1 %
HEMATOCRIT: 41.4 % (ref 39.0–52.0)
HEMOGLOBIN: 14.5 g/dL (ref 13.0–17.0)
LYMPHS ABS: 1.5 10*3/uL (ref 0.7–4.0)
Lymphocytes Relative: 21 %
MCH: 28 pg (ref 26.0–34.0)
MCHC: 35 g/dL (ref 30.0–36.0)
MCV: 80.1 fL (ref 78.0–100.0)
MONOS PCT: 7 %
Monocytes Absolute: 0.5 10*3/uL (ref 0.1–1.0)
NEUTROS PCT: 71 %
Neutro Abs: 5 10*3/uL (ref 1.7–7.7)
PLATELETS: 266 10*3/uL (ref 150–400)
RBC: 5.17 MIL/uL (ref 4.22–5.81)
RDW: 13.6 % (ref 11.5–15.5)
WBC: 7.2 10*3/uL (ref 4.0–10.5)

## 2017-01-03 LAB — RAPID URINE DRUG SCREEN, HOSP PERFORMED
AMPHETAMINES: NOT DETECTED
Barbiturates: NOT DETECTED
Benzodiazepines: NOT DETECTED
COCAINE: NOT DETECTED
OPIATES: NOT DETECTED
Tetrahydrocannabinol: NOT DETECTED

## 2017-01-03 LAB — COMPREHENSIVE METABOLIC PANEL
ALT: 17 U/L (ref 17–63)
ANION GAP: 9 (ref 5–15)
AST: 20 U/L (ref 15–41)
Albumin: 4.3 g/dL (ref 3.5–5.0)
Alkaline Phosphatase: 43 U/L (ref 38–126)
BUN: 15 mg/dL (ref 6–20)
CHLORIDE: 103 mmol/L (ref 101–111)
CO2: 24 mmol/L (ref 22–32)
CREATININE: 1.44 mg/dL — AB (ref 0.61–1.24)
Calcium: 9.2 mg/dL (ref 8.9–10.3)
Glucose, Bld: 108 mg/dL — ABNORMAL HIGH (ref 65–99)
POTASSIUM: 3.3 mmol/L — AB (ref 3.5–5.1)
Sodium: 136 mmol/L (ref 135–145)
Total Bilirubin: 0.7 mg/dL (ref 0.3–1.2)
Total Protein: 7.4 g/dL (ref 6.5–8.1)

## 2017-01-03 LAB — SALICYLATE LEVEL

## 2017-01-03 LAB — ACETAMINOPHEN LEVEL: Acetaminophen (Tylenol), Serum: <10 ug/mL — ABNORMAL LOW (ref 10–30)

## 2017-01-03 LAB — ETHANOL

## 2017-01-03 MED ORDER — TETANUS-DIPHTH-ACELL PERTUSSIS 5-2.5-18.5 LF-MCG/0.5 IM SUSP
0.5000 mL | Freq: Once | INTRAMUSCULAR | Status: AC
  Administered 2017-01-03: 0.5 mL via INTRAMUSCULAR
  Filled 2017-01-03: qty 0.5

## 2017-01-03 MED ORDER — AMOXICILLIN-POT CLAVULANATE 875-125 MG PO TABS
1.0000 | ORAL_TABLET | Freq: Two times a day (BID) | ORAL | Status: AC
  Administered 2017-01-03 – 2017-01-04 (×3): 1 via ORAL
  Filled 2017-01-03 (×3): qty 1

## 2017-01-03 MED ORDER — BACITRACIN ZINC 500 UNIT/GM EX OINT
TOPICAL_OINTMENT | Freq: Two times a day (BID) | CUTANEOUS | Status: DC
  Administered 2017-01-03: 1 via TOPICAL
  Administered 2017-01-04 – 2017-01-05 (×3): via TOPICAL
  Filled 2017-01-03 (×2): qty 28.35
  Filled 2017-01-03: qty 0.9
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
## 2017-01-03 NOTE — ED Notes (Signed)
Bed: WA27 Expected date:  Expected time:  Means of arrival:  Comments: IVC 

## 2017-01-03 NOTE — ED Notes (Signed)
Pt's belongings in locker #27
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
## 2017-01-03 NOTE — ED Notes (Signed)
Hourly rounding reveals patient in room. No complaints, stable, in no acute distress. Q15 minute rounds and monitoring via Security Cameras to continue. 

## 2017-01-03 NOTE — ED Notes (Signed)
Pt. Transferred to [facility] from ED to room after screening for contraband. Report to include Situation, Background, Assessment and Recommendations from Elaine RN. Pt. Oriented to unit including Q15 minute rounds as well as the security cameras for their protection. Patient is alert and oriented, warm and dry in no acute distress. Patient denies SI, HI, and AVH. Pt. Encouraged to let me know if needs arise. 
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
## 2017-01-03 NOTE — ED Notes (Signed)
Patient's mom is Beazer HomesKathy Nesmith. Her number is [phone].

## 2017-01-03 NOTE — ED Notes (Signed)
Pt again denied hearing voices.

## 2017-01-03 NOTE — ED Notes (Signed)
Report given to Elaine, rn.  

## 2017-01-04 DIAGNOSIS — Z818 Family history of other mental and behavioral disorders: Secondary | ICD-10-CM

## 2017-01-04 DIAGNOSIS — Z87891 Personal history of nicotine dependence: Secondary | ICD-10-CM | POA: Diagnosis not present

## 2017-01-04 DIAGNOSIS — F250 Schizoaffective disorder, bipolar type: Secondary | ICD-10-CM

## 2017-01-04 DIAGNOSIS — Z810 Family history of intellectual disabilities: Secondary | ICD-10-CM

## 2017-01-04 MED ORDER — ARIPIPRAZOLE 5 MG PO TABS
5.0000 mg | ORAL_TABLET | Freq: Every day | ORAL | Status: DC
  Administered 2017-01-04: 5 mg via ORAL
  Filled 2017-01-04: qty 1

## 2017-01-04 MED ORDER — TRAZODONE HCL 100 MG PO TABS
100.0000 mg | ORAL_TABLET | Freq: Every day | ORAL | Status: DC
  Administered 2017-01-04: 100 mg via ORAL
  Filled 2017-01-04: qty 1

## 2017-01-04 NOTE — ED Notes (Signed)
Hourly rounding reveals patient sleeping in room. No complaints, stable, in no acute distress. Q15 minute rounds and monitoring via Security Cameras to continue. 

## 2017-01-04 NOTE — Progress Notes (Signed)
01/04/17 1339:  LRT introduced self to pt and offered activities.  Pt was lying down but awake, pt declined.  Caroll RancherMarjette Rawn Quiroa, LRT/CTRS
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
## 2017-01-05 DIAGNOSIS — Z818 Family history of other mental and behavioral disorders: Secondary | ICD-10-CM | POA: Diagnosis not present

## 2017-01-05 DIAGNOSIS — Z810 Family history of intellectual disabilities: Secondary | ICD-10-CM | POA: Diagnosis not present

## 2017-01-05 DIAGNOSIS — Z87891 Personal history of nicotine dependence: Secondary | ICD-10-CM | POA: Diagnosis not present

## 2017-01-05 MED ORDER — ARIPIPRAZOLE ER 400 MG IM SRER: 400.0000 mg | Freq: Once | INTRAMUSCULAR | Status: DC

## 2017-01-05 MED ORDER — ARIPIPRAZOLE ER 400 MG IM SRER
400.0000 mg | Freq: Once | INTRAMUSCULAR | Status: AC
  Administered 2017-01-05: 400 mg via INTRAMUSCULAR

## 2017-01-05 MED ORDER — ARIPIPRAZOLE ER 400 MG IM SRER: 400.0000 mg | Freq: Once | INTRAMUSCULAR | 0 refills | Status: AC

## 2017-01-05 MED ORDER — DIPHENHYDRAMINE HCL 50 MG/ML IJ SOLN
25.0000 mg | Freq: Once | INTRAMUSCULAR | Status: AC
  Administered 2017-01-05: 25 mg via INTRAMUSCULAR
  Filled 2017-01-05: qty 1

## 2017-01-05 MED ORDER — ARIPIPRAZOLE 2 MG PO TABS
2.0000 mg | ORAL_TABLET | Freq: Every day | ORAL | Status: DC
  Administered 2017-01-05: 2 mg via ORAL
  Filled 2017-01-05 (×2): qty 1

## 2017-01-05 MED ORDER — TRAZODONE HCL 100 MG PO TABS: 100.0000 mg | ORAL_TABLET | Freq: Every day | ORAL | 0 refills | Status: AC

## 2017-01-05 MED ORDER — ARIPIPRAZOLE 2 MG PO TABS: 2.0000 mg | ORAL_TABLET | Freq: Every day | ORAL | 0 refills | Status: AC

## 2017-01-05 NOTE — Discharge Instructions (Signed)
For your ongoing mental health needs, you are advised to follow up with [facility].  New and returning patients are seen at their walk-in clinic.  Walk-in hours are Monday - Friday from 8:00 am - 3:00 pm.  Walk-in patients are seen on a first come, first served basis.  Try to arrive as early as possible for he best chance of being seen the same day: ° °     [facility] °     [address] °     [place], [place] [address] °     [phone] °

## 2017-01-05 NOTE — Consult Note (Signed)
[facility] Consult   Reason for Consult:  Delusional  Referring Physician:  EDP Patient Identification: David Novak MRN:  [id number] Principal Diagnosis: Schizoaffective disorder, bipolar type ([place]) Diagnosis:   Patient Active Problem List   Diagnosis Date Noted  . Schizoaffective disorder, bipolar type ([place]) [F25.0] 01/27/2016    Priority: High  . HLD (hyperlipidemia) [E78.5] 03/18/2016    Total Time spent with patient: 30 minutes  Subjective:   David Novak is a 31 y.o. male patient has stabilized  HPI:  31 yo male who presented to the ED from his family after telling them they were all going to hell and noncompliance of medications due to adverse side effects.  Abilify oral was started and injection given today with no adverse effects.  No suicidal/homicidal ideations, hallucinations, or substance abuse.  Stable for discharge.    Past Psychiatric History: schizoaffective disorder  Risk to Self: Suicidal Ideation: No (Pt denies. ) Suicidal Intent: No Is patient at risk for suicide?: No Suicidal Plan?: No Access to Means: No What has been your use of drugs/alcohol within the last 12 months?: UDS is pending. How many times?: 0 Other Self Harm Risks: Pt denies. Triggers for Past Attempts: None known Intentional Self Injurious Behavior: None (Pt denies. ) Risk to Others: None Prior Inpatient Therapy: Prior Inpatient Therapy: No Prior Therapy Dates: NA Prior Therapy Facilty/Provider(s): NA Reason for Treatment: NA Prior Outpatient Therapy: Prior Outpatient Therapy: No Prior Therapy Dates: NA Prior Therapy Facilty/Provider(s): NA Reason for Treatment: NA Does patient have an ACCT team?: No Does patient have Intensive In-House Services?  : No Does patient have [facility] services? : No Does patient have [facility] services?: No  Past Medical History:  Past Medical History:  Diagnosis Date  . Depression   . Suicide attempt [facility])    History reviewed. No pertinent  surgical history. Family History:  Family History  Problem Relation Age of Onset  . Depression Mother   . Learning disabilities Brother   . Depression Brother   . Hyperlipidemia Brother    Family Psychiatric  History: none Social History:  History  Alcohol Use No     History  Drug Use No    Social History   Social History  . Marital status: Single    Spouse name: N/A  . Number of children: N/A  . Years of education: N/A   Social History Main Topics  . Smoking status: Former Research scientist (life sciences)  . Smokeless tobacco: Never Used  . Alcohol use No  . Drug use: No  . Sexual activity: Yes   Other Topics Concern  . None   Social History Narrative  . None   Additional Social History:    Allergies:  No Known Allergies  Labs:  Results for orders placed or performed during the hospital encounter of 01/03/17 (from the past 48 hour(s))  Comprehensive metabolic panel     Status: Abnormal   Collection Time: 01/03/17  7:20 PM  Result Value Ref Range   Sodium 136 135 - 145 mmol/L   Potassium 3.3 (L) 3.5 - 5.1 mmol/L   Chloride 103 101 - 111 mmol/L   CO2 24 22 - 32 mmol/L   Glucose, Bld 108 (H) 65 - 99 mg/dL   BUN 15 6 - 20 mg/dL   Creatinine, Ser 1.44 (H) 0.61 - 1.24 mg/dL   Calcium 9.2 8.9 - 10.3 mg/dL   Total Protein 7.4 6.5 - 8.1 g/dL   Albumin 4.3 3.5 - 5.0 g/dL   AST  20 15 - 41 U/L   ALT 17 17 - 63 U/L   Alkaline Phosphatase 43 38 - 126 U/L   Total Bilirubin 0.7 0.3 - 1.2 mg/dL   GFR calc non Af Amer >60 >60 mL/min   GFR calc Af Amer >60 >60 mL/min    Comment: (NOTE) The eGFR has been calculated using the CKD EPI equation. This calculation has not been validated in all clinical situations. eGFR's persistently <60 mL/min signify possible Chronic Kidney Disease.    Anion gap 9 5 - 15  Ethanol     Status: None   Collection Time: 01/03/17  7:20 PM  Result Value Ref Range   Alcohol, Ethyl (B) <5 <5 mg/dL    Comment:        LOWEST DETECTABLE LIMIT FOR SERUM ALCOHOL IS 5  mg/dL FOR MEDICAL PURPOSES ONLY   Urine rapid drug screen (hosp performed)     Status: None   Collection Time: 01/03/17  7:20 PM  Result Value Ref Range   Opiates NONE DETECTED NONE DETECTED   Cocaine NONE DETECTED NONE DETECTED   Benzodiazepines NONE DETECTED NONE DETECTED   Amphetamines NONE DETECTED NONE DETECTED   Tetrahydrocannabinol NONE DETECTED NONE DETECTED   Barbiturates NONE DETECTED NONE DETECTED    Comment:        DRUG SCREEN FOR MEDICAL PURPOSES ONLY.  IF CONFIRMATION IS NEEDED FOR ANY PURPOSE, NOTIFY LAB WITHIN 5 DAYS.        LOWEST DETECTABLE LIMITS FOR URINE DRUG SCREEN Drug Class       Cutoff (ng/mL) Amphetamine      1000 Barbiturate      200 Benzodiazepine   096 Tricyclics       283 Opiates          300 Cocaine          300 THC              50   CBC with Diff     Status: None   Collection Time: 01/03/17  7:20 PM  Result Value Ref Range   WBC 7.2 4.0 - 10.5 K/uL   RBC 5.17 4.22 - 5.81 MIL/uL   Hemoglobin 14.5 13.0 - 17.0 g/dL   HCT 41.4 39.0 - 52.0 %   MCV 80.1 78.0 - 100.0 fL   MCH 28.0 26.0 - 34.0 pg   MCHC 35.0 30.0 - 36.0 g/dL   RDW 13.6 11.5 - 15.5 %   Platelets 266 150 - 400 K/uL   Neutrophils Relative % 71 %   Neutro Abs 5.0 1.7 - 7.7 K/uL   Lymphocytes Relative 21 %   Lymphs Abs 1.5 0.7 - 4.0 K/uL   Monocytes Relative 7 %   Monocytes Absolute 0.5 0.1 - 1.0 K/uL   Eosinophils Relative 1 %   Eosinophils Absolute 0.1 0.0 - 0.7 K/uL   Basophils Relative 0 %   Basophils Absolute 0.0 0.0 - 0.1 K/uL  Acetaminophen level     Status: Abnormal   Collection Time: 01/03/17  7:20 PM  Result Value Ref Range   Acetaminophen (Tylenol), Serum <10 (L) 10 - 30 ug/mL    Comment:        THERAPEUTIC CONCENTRATIONS VARY SIGNIFICANTLY. A RANGE OF 10-30 ug/mL MAY BE AN EFFECTIVE CONCENTRATION FOR MANY PATIENTS. HOWEVER, SOME ARE BEST TREATED AT CONCENTRATIONS OUTSIDE THIS RANGE. ACETAMINOPHEN CONCENTRATIONS >150 ug/mL AT 4 HOURS AFTER INGESTION AND  >50 ug/mL AT 12 HOURS AFTER INGESTION ARE OFTEN ASSOCIATED WITH TOXIC REACTIONS.  Salicylate level     Status: None   Collection Time: 01/03/17  7:20 PM  Result Value Ref Range   Salicylate Lvl <2.7 2.8 - 30.0 mg/dL    Current Facility-Administered Medications  Medication Dose Route Frequency Provider Last Rate Last Dose  . ARIPiprazole (ABILIFY) tablet 5 mg  5 mg Oral Daily Twilia Yaklin, MD   5 mg at 01/04/17 1151  . ARIPiprazole ER SRER 400 mg  400 mg Intramuscular Once Ambra Haverstick, MD      . bacitracin ointment   Topical BID Leaphart, Chrissie Noa T, PA-C      . diphenhydrAMINE (BENADRYL) injection 25 mg  25 mg Intramuscular Once Janis Sol, MD      . traZODone (DESYREL) tablet 100 mg  100 mg Oral QHS Machel Violante, MD   100 mg at 01/04/17 2054   Current Outpatient Prescriptions  Medication Sig Dispense Refill  . atorvastatin (LIPITOR) 20 MG tablet Take 1 tablet (20 mg total) by mouth at bedtime. (Patient not taking: Reported on 05/08/2016) 90 tablet 3  . diphenhydrAMINE (BENADRYL) 25 MG tablet Take 1 tablet (25 mg total) by mouth every 12 (twelve) hours as needed (twitching or locked jaw). (Patient not taking: Reported on 01/03/2017) 20 tablet 0  . haloperidol decanoate (HALDOL DECANOATE) 100 MG/ML injection Inject 0.8 mLs (80 mg total) into the muscle once. (Due 02-28-16): For mood control (Patient taking differently: Inject 80 mg into the muscle every 28 (twenty-eight) days. For mood control) 1 mL 0    Musculoskeletal: Strength & Muscle Tone: within normal limits Gait & Station: normal Patient leans: N/A  Psychiatric Specialty Exam: Physical Exam  Constitutional: He is oriented to person, place, and time. He appears well-developed and well-nourished.  HENT:  Head: Normocephalic.  Neck: Normal range of motion.  Respiratory: Effort normal.  Musculoskeletal: Normal range of motion.  Neurological: He is alert and oriented to person, place, and time.   Psychiatric: He has a normal mood and affect. His speech is normal and behavior is normal. Judgment and thought content normal. Cognition and memory are normal.    Review of Systems  All other systems reviewed and are negative.   Blood pressure 126/76, pulse 74, temperature 98 F (36.7 C), temperature source Oral, resp. rate 16, height 6' (1.829 m), weight 77.1 kg (170 lb), SpO2 98 %.Body mass index is 23.06 kg/m.  General Appearance: Casual  Eye Contact:  Good  Speech:  Normal Rate  Volume:  Normal  Mood:  Euthymic  Affect:  Flat  Thought Process:  Coherent and Descriptions of Associations: Intact  Orientation:  Full (Time, Place, and Person)  Thought Content:  WDL  Suicidal Thoughts:  No  Homicidal Thoughts:  No  Memory:  Immediate, good; recent, good; remote, good  Judgement:  Fair  Insight:  Fair  Psychomotor Activity:  WDL  Concentration:  Concentration and attention span:  good  Recall:  Good  Fund of Knowledge:  Fair  Language:  Good  Akathisia:  No  Handed:  Right  AIMS (if indicated):     Assets:  Housing Leisure Time Physical Health Resilience Social Support  ADL's:  Intact  Cognition:  WNL  Sleep:        Treatment Plan Summary: Daily contact with patient to assess and evaluate symptoms and progress in treatment, Medication management and Plan schizoaffective disorder, bipolar type:  -Crisis stabilization -Medication management:  Decreased Abilify 5 mg daily to 2 mg for mood and Trazodone 100 mg at bedtime for  sleep.  Abilify injection 400 mg once. -Individual counseling  Disposition:  Discharge home  Waylan Boga, NP 01/05/2017 10:41 AM  Patient seen face-to-face for psychiatric evaluation, chart reviewed and case discussed with the physician extender and developed treatment plan. Reviewed the information documented and agree with the treatment plan. Corena Pilgrim, MD

## 2017-01-05 NOTE — [facility] Suicide Risk Assessment (Signed)
Suicide Risk Assessment  Discharge Assessment   [facility]   Principal Problem: Schizoaffective disorder, bipolar type [facility](HCC) Discharge Diagnoses:  Patient Active Problem List   Diagnosis Date Noted  . Schizoaffective disorder, bipolar type ([facility]) [F25.0] 01/27/2016    Priority: High  . HLD (hyperlipidemia) [E78.5] 03/18/2016    Total Time spent with patient: 45 minutes   Musculoskeletal: Strength & Muscle Tone: within normal limits Gait & Station: normal Patient leans: N/A  Psychiatric Specialty Exam: Physical Exam  Constitutional: He is oriented to person, place, and time. He appears well-developed and well-nourished.  HENT:  Head: Normocephalic.  Neck: Normal range of motion.  Respiratory: Effort normal.  Musculoskeletal: Normal range of motion.  Neurological: He is alert and oriented to person, place, and time.  Psychiatric: He has a normal mood and affect. His speech is normal and behavior is normal. Judgment and thought content normal. Cognition and memory are normal.    Review of Systems  All other systems reviewed and are negative.   Blood pressure 126/76, pulse 74, temperature 98 F (36.7 C), temperature source Oral, resp. rate 16, height 6' (1.829 m), weight 77.1 kg (170 lb), SpO2 98 %.Body mass index is 23.06 kg/m.  General Appearance: Casual  Eye Contact:  Good  Speech:  Normal Rate  Volume:  Normal  Mood:  Euthymic  Affect:  Flat  Thought Process:  Coherent and Descriptions of Associations: Intact  Orientation:  Full (Time, Place, and Person)  Thought Content:  WDL  Suicidal Thoughts:  No  Homicidal Thoughts:  No  Memory:  Immediate, good; recent, good; remote, good  Judgement:  Fair  Insight:  Fair  Psychomotor Activity:  WDL  Concentration:  Concentration and attention span:  good  Recall:  Good  Fund of Knowledge:  Fair  Language:  Good  Akathisia:  No  Handed:  Right  AIMS (if indicated):     Assets:   Housing Leisure Time Physical Health Resilience Social Support  ADL's:  Intact  Cognition:  WNL  Sleep:      Mental Status Per Nursing Assessment::   On Admission:   delusions   Demographic Factors:  Male and Adolescent or young adult  Loss Factors: NA  Historical Factors: NA  Risk Reduction Factors:   Sense of responsibility to family, Living with another person, especially a relative, Positive social support and Positive therapeutic relationship  Continued Clinical Symptoms:  None  Cognitive Features That Contribute To Risk:  None    Suicide Risk:  Minimal: No identifiable suicidal ideation.  Patients presenting with no risk factors but with morbid ruminations; may be classified as minimal risk based on the severity of the depressive symptoms    Plan Of Care/Follow-up recommendations:  Activity:  as tolerated Diet:  heart healthy diet  LORD, JAMISON, NP 01/05/2017, 10:47 AM

## 2017-01-05 NOTE — [facility] Assessment (Signed)
[facility] Assessment Progress Note  Per Mojeed Akintayo, MD, this pt does not require psychiatric hospitalization at this time.  Pt is to be discharged from [facility] with recommendation to follow up with [facility].  This has been included in pt's discharge instructions.  Pt's nurse, Edie, has been notified.  Stesha Neyens, MA Triage Specialist [phone]     

## 2017-01-05 NOTE — ED Notes (Signed)
Pt discharged safely .  Discharge instructions and RX reviewed.  All belongings were returned and a clean t-shirt and shorts were given by Cha[place]in.  Bus pass was given.

## 2017-01-05 NOTE — ED Notes (Signed)
Patient awake, resting in bed and with no signs of distress noted.

## 2017-09-07 ENCOUNTER — Emergency Department (HOSPITAL_COMMUNITY): Payer: Medicaid Other

## 2017-09-07 ENCOUNTER — Emergency Department (HOSPITAL_COMMUNITY)
Admission: EM | Admit: 2017-09-07 | Discharge: 2017-09-08 | Disposition: A | Discharge status: Home / Self Care | Payer: Medicaid Other | Attending: Emergency Medicine | Admitting: Emergency Medicine

## 2017-09-07 ENCOUNTER — Other Ambulatory Visit: Payer: Self-pay

## 2017-09-07 ENCOUNTER — Encounter (HOSPITAL_COMMUNITY): Payer: Self-pay

## 2017-09-07 DIAGNOSIS — R55 Syncope and collapse: Secondary | ICD-10-CM | POA: Diagnosis present

## 2017-09-07 DIAGNOSIS — Z79899 Other long term (current) drug therapy: Secondary | ICD-10-CM | POA: Insufficient documentation

## 2017-09-07 DIAGNOSIS — Z87891 Personal history of nicotine dependence: Secondary | ICD-10-CM | POA: Diagnosis not present

## 2017-09-07 LAB — RAPID URINE DRUG SCREEN, HOSP PERFORMED
Amphetamines: NOT DETECTED
BENZODIAZEPINES: NOT DETECTED
Barbiturates: NOT DETECTED
Cocaine: NOT DETECTED
OPIATES: NOT DETECTED
TETRAHYDROCANNABINOL: NOT DETECTED

## 2017-09-07 LAB — URINALYSIS, ROUTINE W REFLEX MICROSCOPIC
Bilirubin Urine: NEGATIVE
GLUCOSE, UA: NEGATIVE mg/dL
Ketones, ur: NEGATIVE mg/dL
NITRITE: NEGATIVE
Protein, ur: 30 mg/dL — AB
Specific Gravity, Urine: 1.026 (ref 1.005–1.030)
Squamous Epithelial / LPF: NONE SEEN
pH: 5 (ref 5.0–8.0)

## 2017-09-07 LAB — CBC WITH DIFFERENTIAL/PLATELET
BASOS PCT: 1 %
Basophils Absolute: 0.1 10*3/uL (ref 0.0–0.1)
EOS ABS: 0.1 10*3/uL (ref 0.0–0.7)
Eosinophils Relative: 1 %
HCT: 45.3 % (ref 39.0–52.0)
Hemoglobin: 15.2 g/dL (ref 13.0–17.0)
LYMPHS ABS: 1.3 10*3/uL (ref 0.7–4.0)
Lymphocytes Relative: 22 %
MCH: 28.8 pg (ref 26.0–34.0)
MCHC: 33.6 g/dL (ref 30.0–36.0)
MCV: 86 fL (ref 78.0–100.0)
MONO ABS: 0.4 10*3/uL (ref 0.1–1.0)
MONOS PCT: 7 %
NEUTROS PCT: 69 %
Neutro Abs: 4.2 10*3/uL (ref 1.7–7.7)
PLATELETS: 261 10*3/uL (ref 150–400)
RBC: 5.27 MIL/uL (ref 4.22–5.81)
RDW: 13.7 % (ref 11.5–15.5)
WBC: 6.1 10*3/uL (ref 4.0–10.5)

## 2017-09-07 LAB — BASIC METABOLIC PANEL
ANION GAP: 8 (ref 5–15)
BUN: 9 mg/dL (ref 6–20)
CHLORIDE: 105 mmol/L (ref 101–111)
CO2: 27 mmol/L (ref 22–32)
Calcium: 9.2 mg/dL (ref 8.9–10.3)
Creatinine, Ser: 1 mg/dL (ref 0.61–1.24)
GFR calc Af Amer: >60 mL/min (ref >60)
GLUCOSE: 101 mg/dL — AB (ref 65–99)
POTASSIUM: 3.9 mmol/L (ref 3.5–5.1)
SODIUM: 140 mmol/L (ref 135–145)

## 2017-09-07 MED ORDER — SODIUM CHLORIDE 0.9 % IV BOLUS (SEPSIS)
1000.0000 mL | Freq: Once | INTRAVENOUS | Status: AC
  Administered 2017-09-08: 1000 mL via INTRAVENOUS

## 2017-09-07 NOTE — ED Provider Notes (Signed)
Patient placed in Quick Look pathway, seen and evaluated   Chief Complaint: LOC  HPI:   32 year old male presenting to the emergency department with a chief complaint of loss of consciousness.  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 He ports that coworkers told him that his arms and legs were shaking and jerking during the episode.  He is unsure how long it lasted. He states that he has felt tired since the incident and is thirsty. Denies headache, nausea, vomiting, chest pain, or dyspnea. No history of seizures.  He reports that he has eaten and drank today. He denies IV or recreational drugs, alcohol use, or daily medications or supplements. He has had one similar episode several weeks ago.   ROS: Loss of consciousness  Physical Exam:   Gen: No distress  Neuro: Awake and Alert  Skin: Warm    Focused Exam: Small hematoma located over the right glabella with a small superficial abrasion. NAD.    Initiation of care has begun. The patient has been counseled on the process, plan, and necessity for staying for the completion/evaluation, and the remainder of the medical screening examination    Barkley BoardsMcDonald, Aprille Sawhney A, PA-C 09/07/17 1737    Raeford RazorKohut, Stephen, MD 09/07/17 1931

## 2017-09-07 NOTE — ED Triage Notes (Signed)
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 PT states people working with him said he fall forward, hit head, and was shaking all over and having seizure. No urinary insentience or oral trauma. Pt does not remember the event. Denies hx of seizures

## 2017-09-08 NOTE — ED Provider Notes (Signed)
Emergency Department Provider Note   I have reviewed the triage vital signs and the nursing notes.   HISTORY  Chief Complaint Loss of Consciousness   HPI David Novak is a 32 y.o. male depression presents to the emergency department for evaluation after passing out at work.  The patient was at work when he suddenly felt very hot all over and then according to coworkers passed out.  He states that he fell forward and struck his head.  Coworkers had some concern that he was having a seizure because they noticed some shaking.  Unclear how David Novak this lasted but eventually patient regained consciousness and presented to the emergency department.  He has no prior history of seizure.  He does not recall the events during the syncope/seizure episode.  He does not drive a car.  He denies any alcohol or drug use.  He has been compliant with his other medications with no recent changes.  Patient feels he has been getting enough sleep.  He has been drinking less than normal think she may be dehydrated.  Past Medical History:  Diagnosis Date  . Depression   . Suicide attempt [facility])     Patient Active Problem List   Diagnosis Date Noted  . HLD (hyperlipidemia) 03/18/2016  . Schizoaffective disorder, bipolar type ([facility]) 01/27/2016    History reviewed. No pertinent surgical history.  Current Outpatient Rx  . Order #: [id number] Class: Normal  . Order #: [id number] Class: Normal  . Order #: [id number] Class: Normal  . Order #: [id number] Class: Print  . Order #: [id number] Class: Normal  . Order #: [id number] Class: Normal    Allergies Patient has no known allergies.  Family History  Problem Relation Age of Onset  . Depression Mother   . Learning disabilities Brother   . Depression Brother   . Hyperlipidemia Brother     Social History Social History   Tobacco Use  . Smoking status: Former Games developer  . Smokeless tobacco: Never Used  Substance Use Topics  . Alcohol use: No  . Drug use: No     Review of Systems  Constitutional: No fever/chills. Positive lightheadedness and syncope vs seizure  Eyes: No visual changes. ENT: No sore throat. Cardiovascular: Denies chest pain. Respiratory: Denies shortness of breath. Gastrointestinal: No abdominal pain.  No nausea, no vomiting.  No diarrhea.  No constipation. Genitourinary: Negative for dysuria. Musculoskeletal: Negative for back pain. Skin: Negative for rash. Neurological: Negative for headaches, focal weakness or numbness.  10-point ROS otherwise negative.  ____________________________________________   PHYSICAL EXAM:  VITAL SIGNS: ED Triage Vitals  Enc Vitals Group     BP 09/07/17 1956 125/88     Pulse Rate 09/07/17 1956 66     Resp 09/07/17 1956 18     Temp 09/07/17 2223 98 F (36.7 C)     Temp Source 09/07/17 2223 Oral     SpO2 09/07/17 1956 96 %   Constitutional: Alert and oriented. Well appearing and in no acute distress. Eyes: Conjunctivae are normal. PERRL.  Head: Atraumatic. Nose: No congestion/rhinnorhea. Mouth/Throat: Mucous membranes are dry.  Neck: No stridor.  Cardiovascular: Normal rate, regular rhythm. Good peripheral circulation. Grossly normal heart sounds.   Respiratory: Normal respiratory effort.  No retractions. Lungs CTAB. Gastrointestinal: Soft and nontender. No distention.  Musculoskeletal: No lower extremity tenderness nor edema. No gross deformities of extremities. Neurologic:  Normal speech and language. No gross focal neurologic deficits are appreciated.  Skin:  Skin is warm, dry and intact. No rash noted. ____________________________________________  LABS (all labs ordered are listed, but only abnormal results are displayed)  Labs Reviewed  URINALYSIS, ROUTINE W REFLEX MICROSCOPIC - Abnormal; Notable for the following components:      Result Value   APPearance CLOUDY (*)    Hgb urine dipstick SMALL (*)    Protein, ur 30 (*)    Leukocytes, UA LARGE (*)    Bacteria, UA  RARE (*)    All other components within normal limits  BASIC METABOLIC PANEL - Abnormal; Notable for the following components:   Glucose, Bld 101 (*)    All other components within normal limits  CBC WITH DIFFERENTIAL/PLATELET  RAPID URINE DRUG SCREEN, HOSP PERFORMED   ____________________________________________  EKG   EKG Interpretation  Date/Time:  Tuesday September 07 2017 16:54:18 EDT Ventricular Rate:  89 PR Interval:  150 QRS Duration: 88 QT Interval:  356 QTC Calculation: 433 R Axis:   142 Text Interpretation:  Normal sinus rhythm with sinus arrhythmia Right atrial enlargement Right ventricular hypertrophy Abnormal ECG Confirmed by Raeford Razor [phone]) on 09/07/2017 5:15:33 PM       ____________________________________________  RADIOLOGY  Ct Head Wo Contrast  Result Date: 09/07/2017 CLINICAL DATA:  Seizure.  Head trauma. EXAM: CT HEAD WITHOUT CONTRAST TECHNIQUE: Contiguous axial images were obtained from the base of the skull through the vertex without intravenous contrast. COMPARISON:  None. FINDINGS: Brain: No mass lesion, intraparenchymal hemorrhage or extra-axial collection. No evidence of acute cortical infarct. Normal appearance of the brain parenchyma and extra axial spaces for age. Vascular: No hyperdense vessel or unexpected vascular calcification. Skull: Normal visualized skull base, calvarium and extracranial soft tissues. Sinuses/Orbits: No sinus fluid levels or advanced mucosal thickening. No mastoid effusion. Normal orbits. IMPRESSION: Normal head CT. Electronically Signed   By: Deatra Robinson M.D.   On: 09/07/2017 18:45    ____________________________________________   PROCEDURES  Procedure(s) performed:   Procedures  None ____________________________________________   INITIAL IMPRESSION / ASSESSMENT AND PLAN / ED COURSE  Pertinent labs & imaging results that were available during my care of the patient were reviewed by me and considered in my  medical decision making (see chart for details).  She presents to the emergency department for evaluation after possible syncope event versus seizure.  The patient returned to his mental status baseline.  He has no stigmata of seizure.  No prior history of seizure.  He appears clinically dehydrated.  Feeling warm prior to this event and other clinical features make me more concerned for possible syncope.  He does not drive a car.  The CT scan of the head and lab work is largely unremarkable.  Given that he is clinically dehydrated I plan for IV fluids.  Vital signs and EKG reviewed.  Plan to provide contact information for cardiology and neurology at discharge after IVF.   At this time, I do not feel there is any life-threatening condition present. I have reviewed and discussed all results (EKG, imaging, lab, urine as appropriate), exam findings with patient. I have reviewed nursing notes and appropriate previous records.  I feel the patient is safe to be discharged home without further emergent workup. Discussed usual and customary return precautions. Patient and family (if present) verbalize understanding and are comfortable with this plan.  Patient will follow-up with their primary care provider. If they do not have a primary care provider, information for follow-up has been provided to them. All questions have been answered.  ____________________________________________  FINAL CLINICAL IMPRESSION(S) / ED DIAGNOSES  Final diagnoses:  Syncope and collapse     MEDICATIONS GIVEN DURING THIS VISIT:  Medications  sodium chloride 0.9 % bolus 1,000 mL (1,000 mLs Intravenous New Bag/Given 09/08/17 0021)    Note:  This document was prepared using Dragon voice recognition software and may include unintentional dictation errors.  Alona BeneJoshua Baltasar Twilley, MD Emergency Medicine    Kati Riggenbach, Arlyss RepressJoshua G, MD 09/08/17 [id number]

## 2017-09-08 NOTE — Discharge Instructions (Signed)

## 2017-09-08 NOTE — ED Notes (Signed)
No e-signature pad available; patient verbalized understanding,.

## 2017-09-15 IMAGING — DX DG CHEST 1V PORT
2 series · 2 of 2 positions shown · non-contrast
Comparison: None.

CLINICAL DATA: Medical clearance.

EXAM:
PORTABLE CHEST 1 VIEW

[chest ap (1 of 2)]
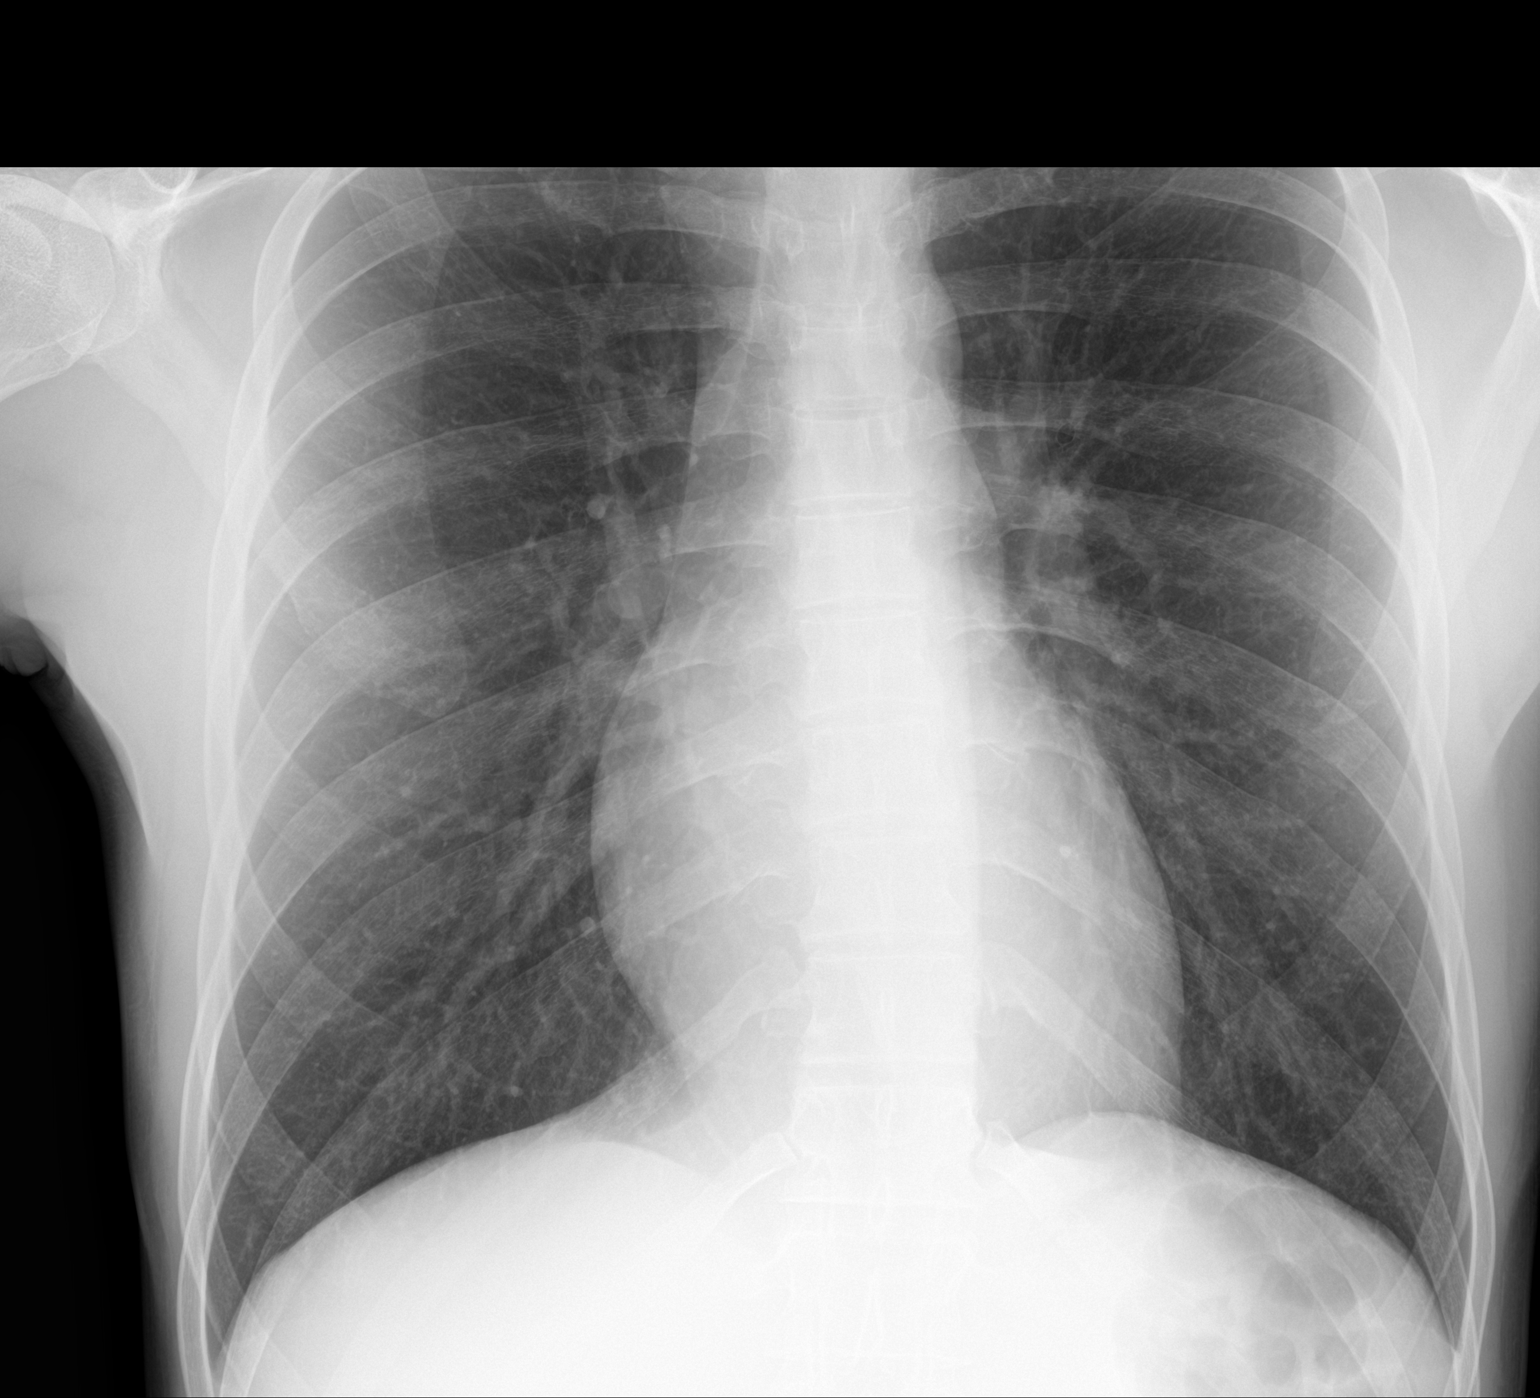

[chest ap (2 of 2)]
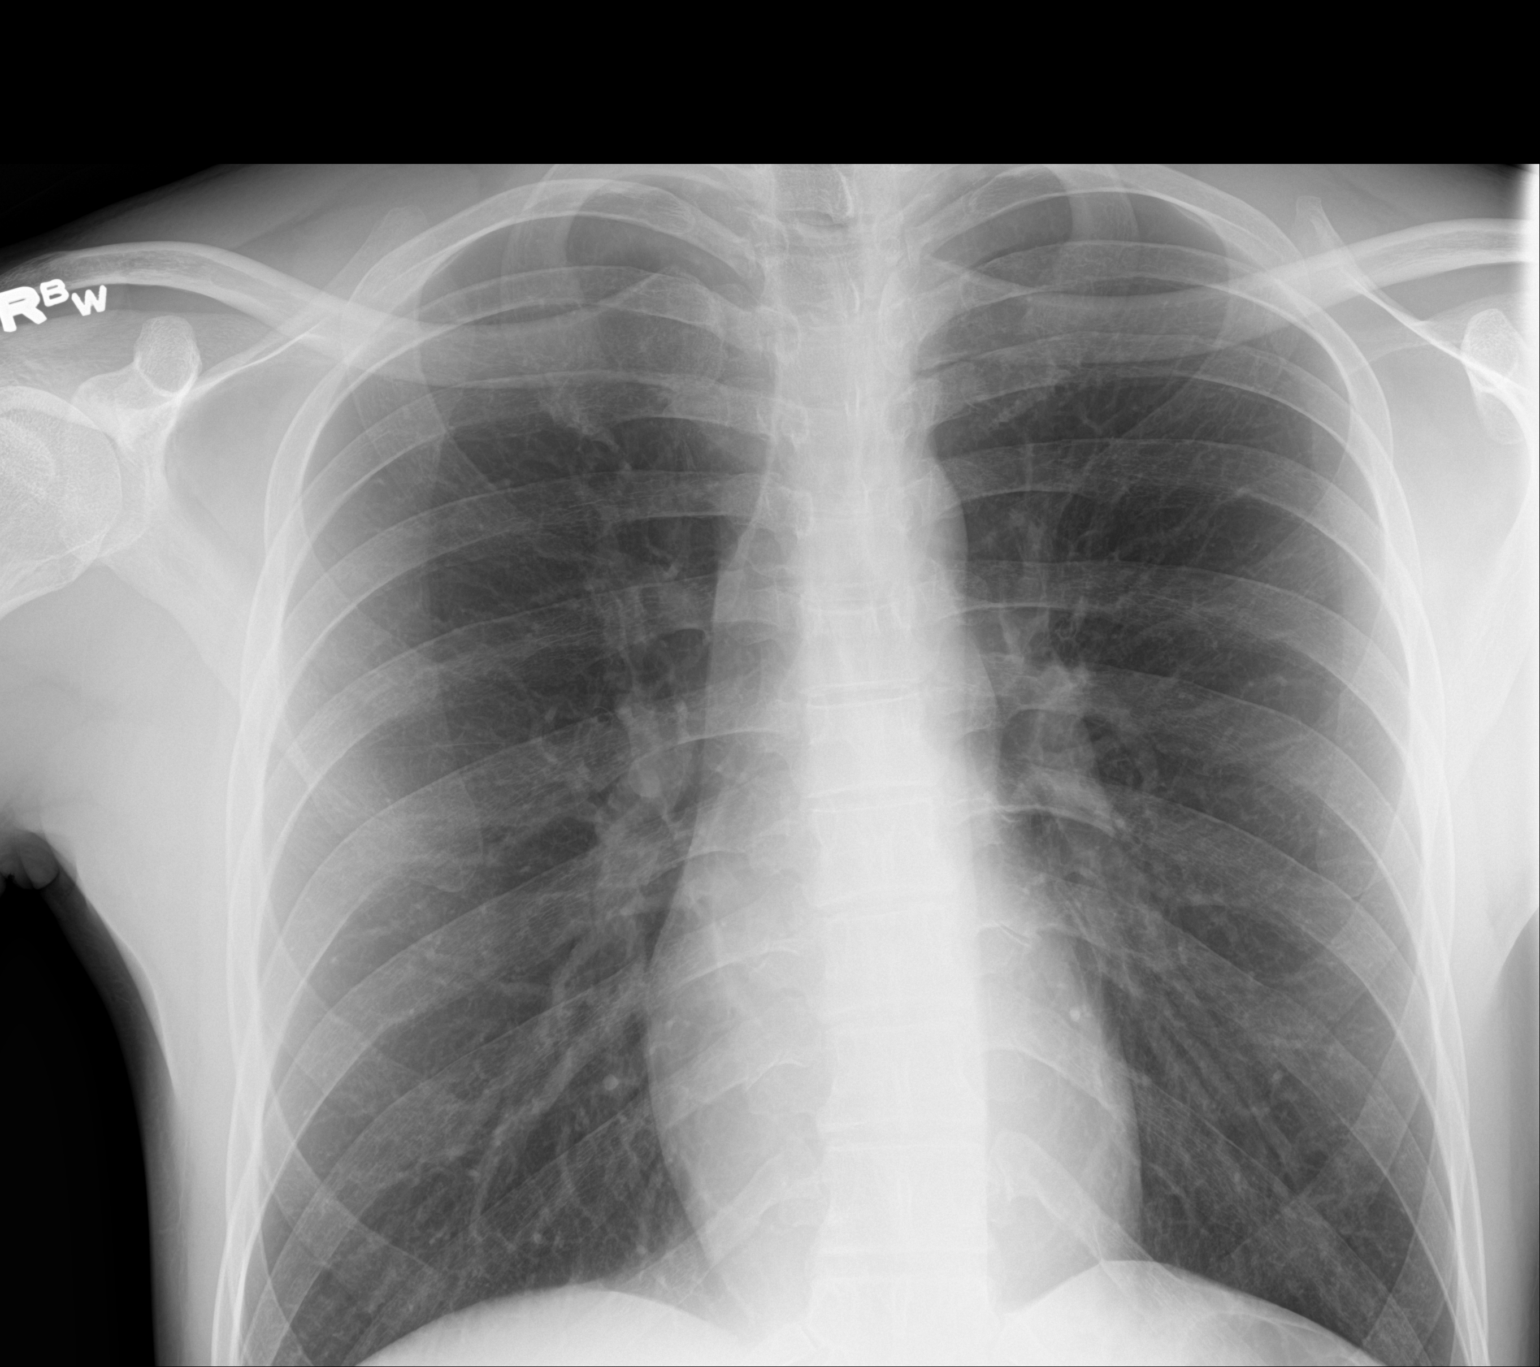

[2 of 2 positions shown; findings below may reference images not displayed]

FINDINGS: The heart size and mediastinal contours are within normal limits.
Both lungs are clear. The visualized skeletal structures are
unremarkable.
IMPRESSION: No active disease.

## 2019-05-01 ENCOUNTER — Encounter (HOSPITAL_COMMUNITY): Payer: Self-pay | Admitting: Emergency Medicine

## 2019-05-01 ENCOUNTER — Emergency Department (HOSPITAL_COMMUNITY)
Admission: EM | Admit: 2019-05-01 | Discharge: 2019-05-01 | Disposition: A | Discharge status: Home / Self Care | Payer: Medicare Other | Attending: Emergency Medicine | Admitting: Emergency Medicine

## 2019-05-01 ENCOUNTER — Other Ambulatory Visit: Payer: Self-pay

## 2019-05-01 DIAGNOSIS — Z87891 Personal history of nicotine dependence: Secondary | ICD-10-CM | POA: Insufficient documentation

## 2019-05-01 DIAGNOSIS — L989 Disorder of the skin and subcutaneous tissue, unspecified: Secondary | ICD-10-CM | POA: Insufficient documentation

## 2019-05-01 DIAGNOSIS — Z79899 Other long term (current) drug therapy: Secondary | ICD-10-CM | POA: Insufficient documentation

## 2019-05-01 IMAGING — CT CT HEAD W/O CM
4 series · 16 of 47 positions shown, 18 images · non-contrast
Comparison: None.

CLINICAL DATA: Seizure.  Head trauma.

EXAM:
CT HEAD WITHOUT CONTRAST
TECHNIQUE: Contiguous axial images were obtained from the base of the skull
through the vertex without intravenous contrast.

[Series 3: head wo · axial · 0.44mm/px · z∈[+1095,+1220]mm · 7 of 35 slices shown, 9 images]
[im 5/35  brain]
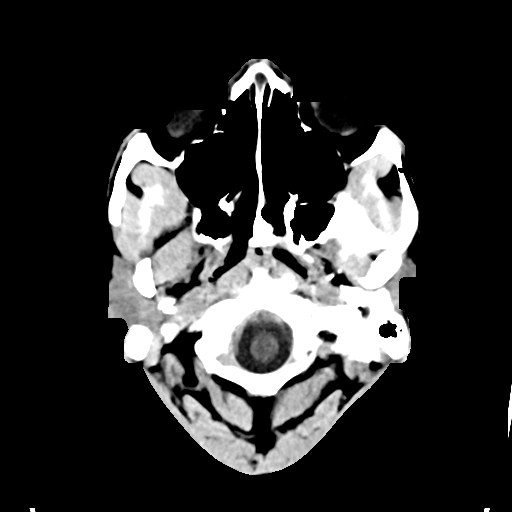
[im 5/35  bone]
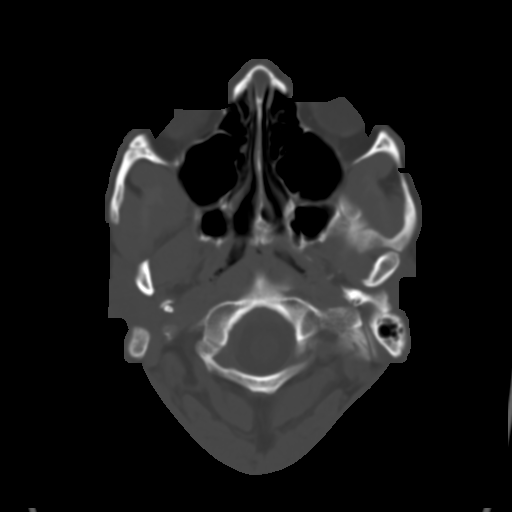
[im 9/35  brain]
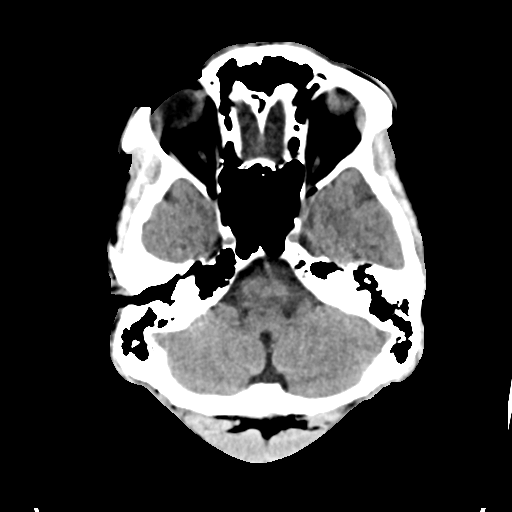
[im 13/35  brain]
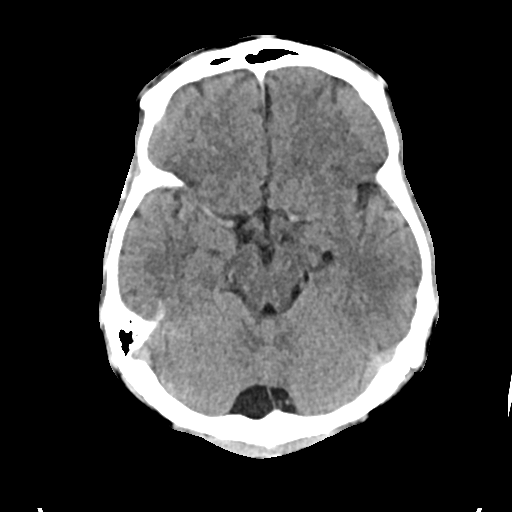
[im 18/35  brain]
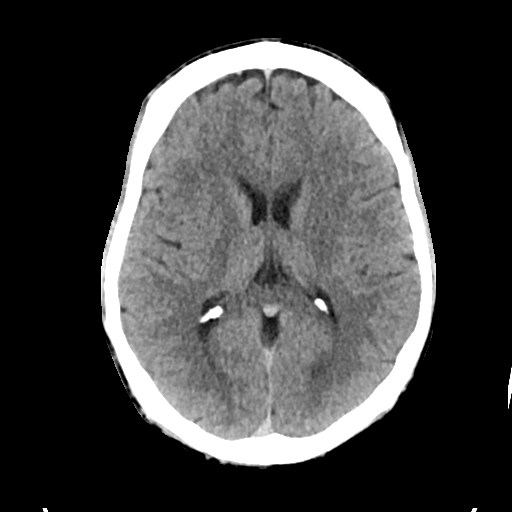
[im 22/35  brain]
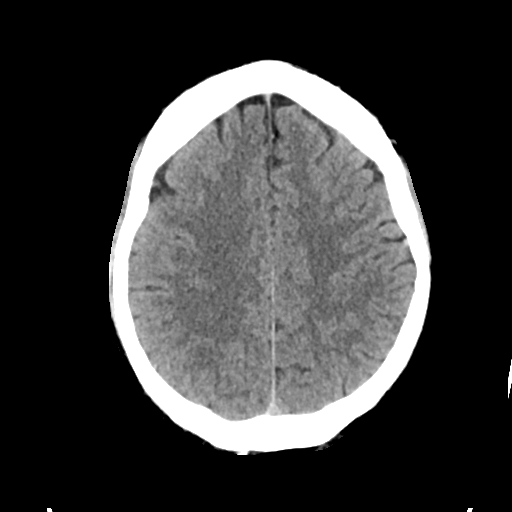
[im 22/35  bone]
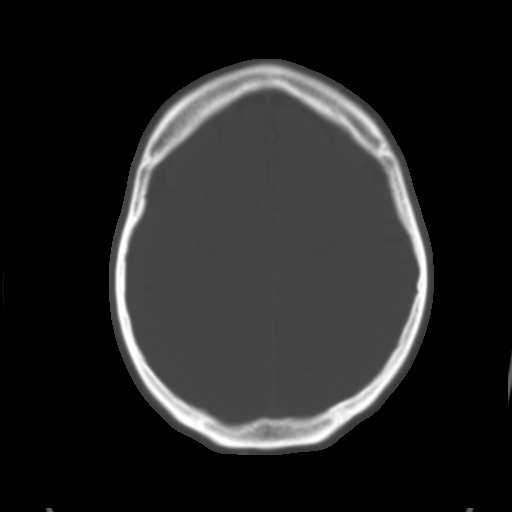
[im 26/35  brain]
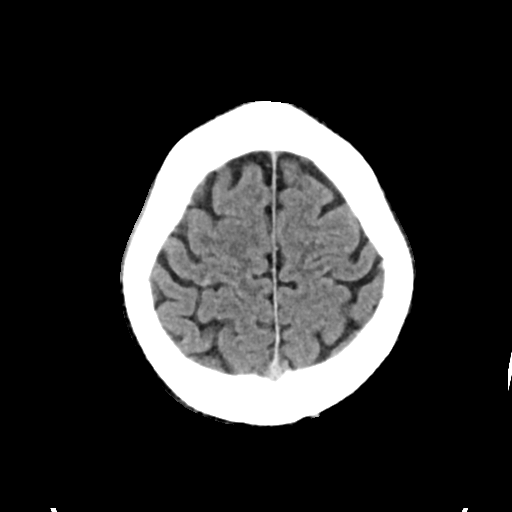
[im 30/35  brain]
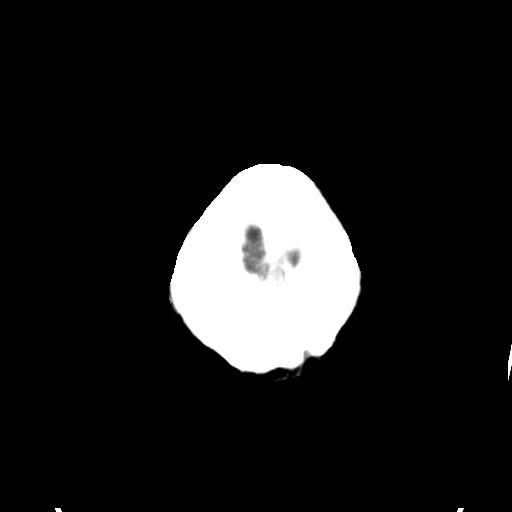

[Series 4: head bone · axial · 0.44mm/px · z∈[+1091,+1125]mm · 3 of 87 slices shown]
[im 9/87  bone]
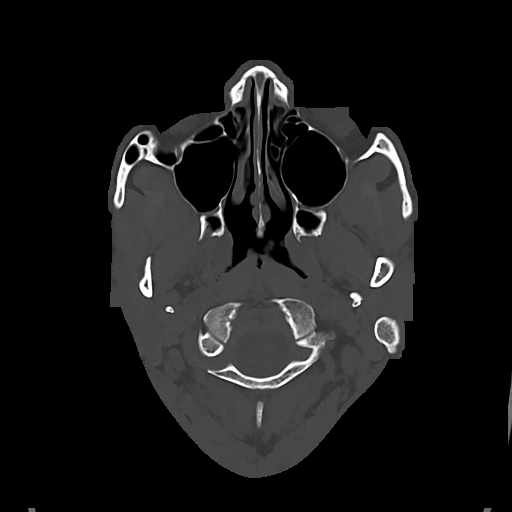
[im 18/87  bone]
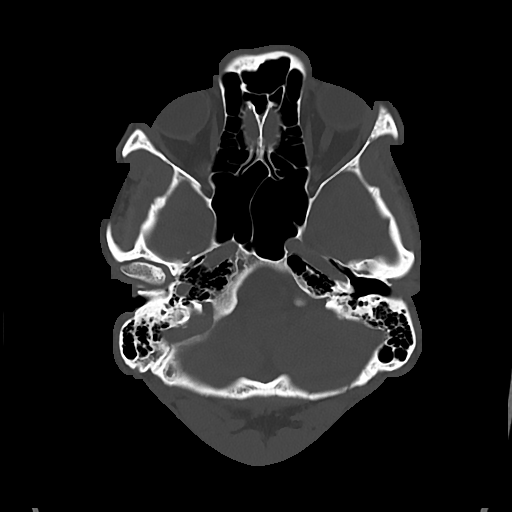
[im 26/87  bone]
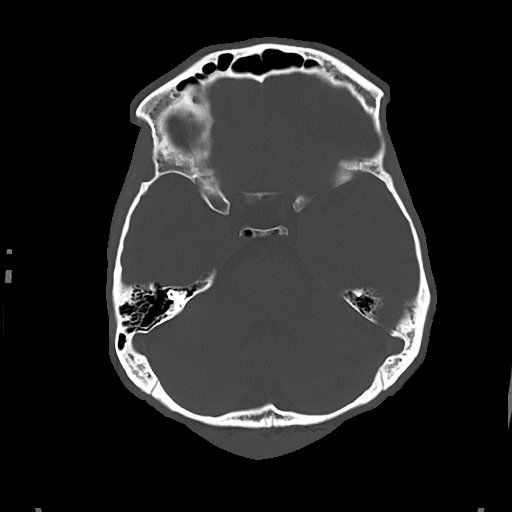

[Series 5: cor soft · coronal · 0.31mm/px · 3 of 70 slices shown]
[im 24/70  brain]
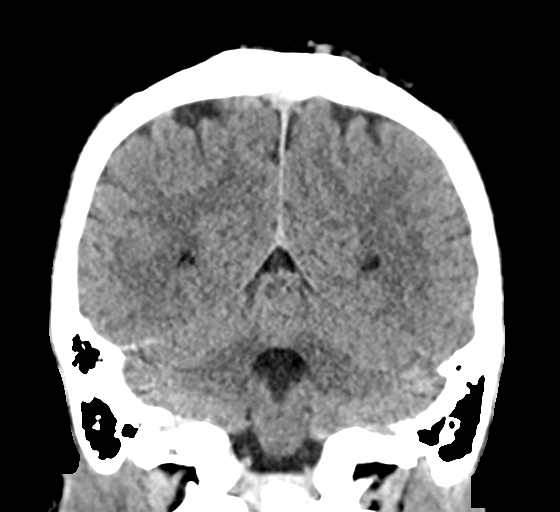
[im 31/70  brain]
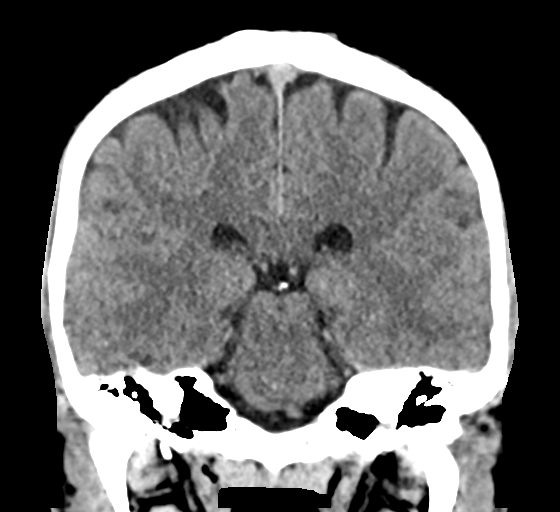
[im 39/70  brain]
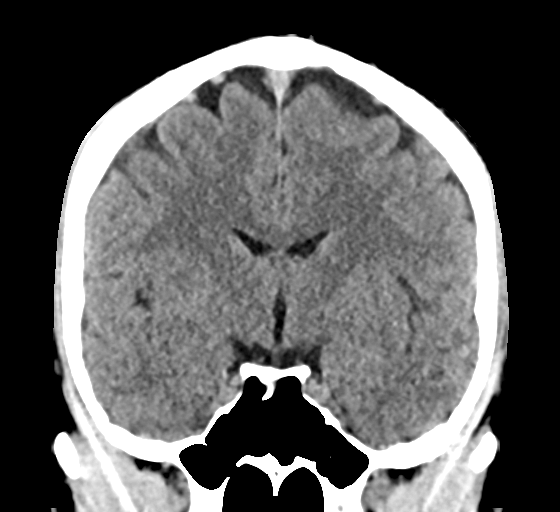

[Series 6: sag soft · sagittal · 0.31mm/px · 3 of 59 slices shown]
[im 20/59  brain]
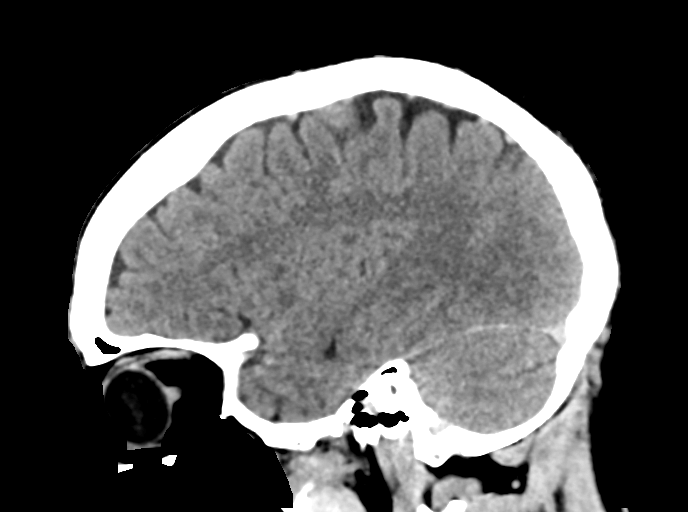
[im 30/59  brain]
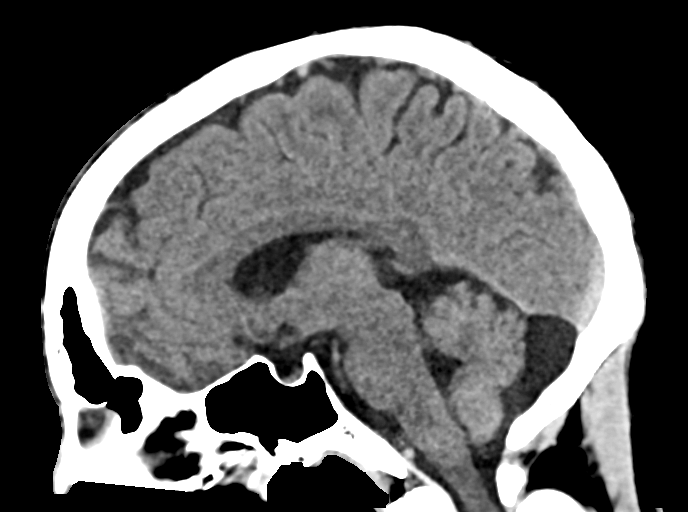
[im 39/59  brain]
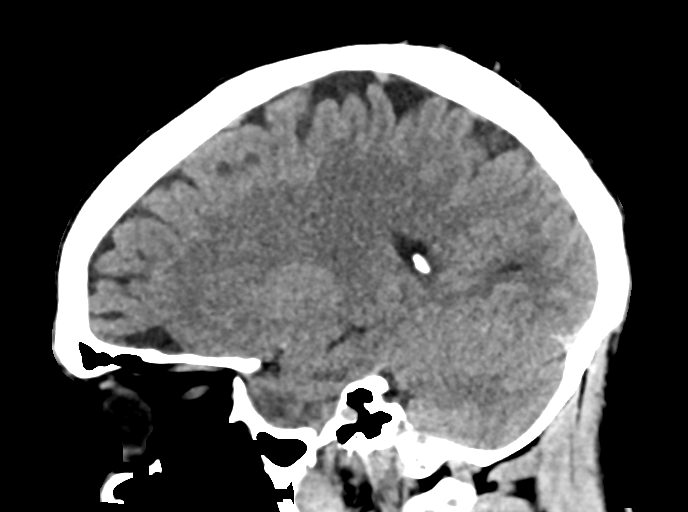

[16 of 47 positions shown; findings below may reference images not displayed]

FINDINGS: Brain: No mass lesion, intraparenchymal hemorrhage or extra-axial
collection. No evidence of acute cortical infarct. Normal appearance
of the brain parenchyma and extra axial spaces for age.

Vascular: No hyperdense vessel or unexpected vascular calcification.

Skull: Normal visualized skull base, calvarium and extracranial soft
tissues.

Sinuses/Orbits: No sinus fluid levels or advanced mucosal
thickening. No mastoid effusion. Normal orbits.
IMPRESSION: Normal head CT.

## 2019-05-01 MED ORDER — ACETAMINOPHEN 500 MG PO TABS
1000.0000 mg | ORAL_TABLET | Freq: Once | ORAL | Status: AC
  Administered 2019-05-01: 1000 mg via ORAL
  Filled 2019-05-01: qty 2

## 2019-05-01 NOTE — ED Provider Notes (Signed)
[facility]-EMERGENCY DEPT Provider Note   CSN: [id number] Arrival date & time: 05/01/19  0030     History   Chief Complaint Chief Complaint  Patient presents with  . wellness check    HPI David Novak is a 33 y.o. male.     The history is provided by the patient.  Illness Location:  Thigh Quality:  Raised lesion for at least 10 years  Severity:  Mild Onset quality:  Gradual Duration:  120 months Timing:  Constant Progression:  Unchanged Chronicity:  Chronic Context:  None, denies trauma Relieved by:  Nothing Worsened by:  Nothing Ineffective treatments:  Nothing Associated symptoms: no abdominal pain, no chest pain, no congestion, no cough, no diarrhea, no ear pain, no fatigue, no fever, no headaches, no loss of consciousness, no myalgias, no nausea, no rash, no rhinorrhea, no shortness of breath, no sore throat, no vomiting and no wheezing   Also would like a full physical.  No other complaints  Past Medical History:  Diagnosis Date  . Depression   . Suicide attempt [facility](HCC)     Patient Active Problem List   Diagnosis Date Noted  . HLD (hyperlipidemia) 03/18/2016  . Schizoaffective disorder, bipolar type (HCC) 01/27/2016    History reviewed. No pertinent surgical history.      Home Medications    Prior to Admission medications   Medication Sig Start Date End Date Taking? Authorizing Provider  ARIPiprazole (ABILIFY) 2 MG tablet Take 1 tablet (2 mg total) by mouth daily. 01/05/17   Charm RingsLord, Jamison Y, NP  ARIPiprazole ER 400 MG SRER Inject 400 mg into the muscle once. 01/05/17 01/05/17  Charm RingsLord, Jamison Y, NP  atorvastatin (LIPITOR) 20 MG tablet Take 1 tablet (20 mg total) by mouth at bedtime. Patient not taking: Reported on 05/08/2016 04/27/16   Casey BurkittFitzgerald, Hillary Moen, MD  traZODone (DESYREL) 100 MG tablet Take 1 tablet (100 mg total) by mouth at bedtime. 01/05/17   Charm RingsLord, Jamison Y, NP    Family History Family History  Problem Relation Age of  Onset  . Depression Mother   . Learning disabilities Brother   . Depression Brother   . Hyperlipidemia Brother     Social History Social History   Tobacco Use  . Smoking status: Former Games developermoker  . Smokeless tobacco: Never Used  Substance Use Topics  . Alcohol use: No  . Drug use: No     Allergies   Patient has no known allergies.   Review of Systems Review of Systems  Constitutional: Negative for fatigue and fever.  HENT: Negative for congestion, ear pain, rhinorrhea and sore throat.   Respiratory: Negative for cough, shortness of breath and wheezing.   Cardiovascular: Negative for chest pain.  Gastrointestinal: Negative for abdominal pain, diarrhea, nausea and vomiting.  Musculoskeletal: Negative for myalgias.  Skin: Negative for rash.  Neurological: Negative for loss of consciousness and headaches.     Physical Exam Updated Vital Signs BP 134/89 (BP Location: Left Arm)   Pulse 84   Temp 98.7 F (37.1 C) (Oral)   Resp 18   Ht 6' (1.829 m)   Wt 68 kg   SpO2 96%   BMI 20.34 kg/m   Physical Exam Vitals signs and nursing note reviewed.  Constitutional:      General: He is not in acute distress.    Appearance: He is normal weight.  HENT:     Head: Normocephalic and atraumatic.     Nose: Nose normal.  Eyes:  Conjunctiva/sclera: Conjunctivae normal.     Pupils: Pupils are equal, round, and reactive to light.  Neck:     Musculoskeletal: Normal range of motion and neck supple.  Cardiovascular:     Rate and Rhythm: Normal rate and regular rhythm.     Pulses: Normal pulses.     Heart sounds: Normal heart sounds.  Pulmonary:     Effort: Pulmonary effort is normal.     Breath sounds: Normal breath sounds.  Abdominal:     General: Abdomen is flat. Bowel sounds are normal.     Tenderness: There is no abdominal tenderness. There is no guarding.  Musculoskeletal: Normal range of motion.  Lymphadenopathy:     Head:     Right side of head: No submental,  submandibular or preauricular adenopathy.     Left side of head: No submental, submandibular or preauricular adenopathy.     Cervical: No cervical adenopathy.     Upper Body:     Right upper body: No supraclavicular or axillary adenopathy.     Left upper body: No supraclavicular or axillary adenopathy.     Lower Body: No right inguinal adenopathy. No left inguinal adenopathy.  Skin:    General: Skin is warm and dry.     Capillary Refill: Capillary refill takes less than 2 seconds.       Neurological:     General: No focal deficit present.     Mental Status: He is alert and oriented to person, place, and time.  Psychiatric:        Thought Content: Thought content normal.      ED Treatments / Results  Labs (all labs ordered are listed, but only abnormal results are displayed) Labs Reviewed - No data to display  EKG None  Radiology No results found.  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  There is no LAN.  This is not a new or emergent issue.  Patient instructed to follow up with his PMD for full physical and work up of thigh lesion.  Patient verbalizes understanding and agrees to follow up  [name] was evaluated in Emergency Department on 05/01/2019 for the symptoms described in the history of present illness. He was evaluated in the context of the global COVID-19 pandemic, which necessitated consideration that the patient might be at risk for infection with the SARS-CoV-2 virus that causes COVID-19. Institutional protocols and algorithms that pertain to the evaluation of patients at risk for COVID-19 are in a state of rapid change based on information released by regulatory bodies including the CDC and federal and state organizations. These  policies and algorithms were followed during the patient's care in the ED.  Final Clinical Impressions(s) / ED Diagnoses   Return for weakness, numbness, changes in vision or speech, fevers >100.4 unrelieved by medication, shortness of breath, intractable vomiting, or diarrhea, abdominal pain, Inability to tolerate liquids or food, cough, altered mental status or any concerns. No signs of systemic illness or infection. The patient is nontoxic-appearing on exam and vital signs are within normal limits.   I have reviewed the triage vital signs and the nursing notes. Pertinent labs &imaging results that were available during my care of the patient were reviewed by me and considered in my medical decision making (  see chart for details).  After history, exam, and medical workup I feel the patient has been appropriately medically screened and is safe for discharge home. Pertinent diagnoses were discussed with the patient. Patient was given return precautions   Kelsea Mousel, MD 05/01/19 [phone]

## 2019-05-01 NOTE — ED Triage Notes (Signed)
Pt reports that he wants to get a disability check up.

## 2023-08-16 ENCOUNTER — Other Ambulatory Visit: Payer: Self-pay

## 2023-08-16 ENCOUNTER — Emergency Department (HOSPITAL_BASED_OUTPATIENT_CLINIC_OR_DEPARTMENT_OTHER)
Admission: EM | Admit: 2023-08-16 | Discharge: 2023-08-17 | Disposition: A | Discharge status: Other Acute Inpatient Hospital | Payer: Medicare Other | Attending: Emergency Medicine | Admitting: Emergency Medicine

## 2023-08-16 ENCOUNTER — Encounter (HOSPITAL_BASED_OUTPATIENT_CLINIC_OR_DEPARTMENT_OTHER): Payer: Self-pay | Admitting: *Deleted

## 2023-08-16 DIAGNOSIS — F209 Schizophrenia, unspecified: Secondary | ICD-10-CM | POA: Diagnosis present

## 2023-08-16 DIAGNOSIS — Z79899 Other long term (current) drug therapy: Secondary | ICD-10-CM | POA: Insufficient documentation

## 2023-08-16 DIAGNOSIS — Z9151 Personal history of suicidal behavior: Secondary | ICD-10-CM | POA: Insufficient documentation

## 2023-08-16 DIAGNOSIS — Z91148 Patient's other noncompliance with medication regimen for other reason: Secondary | ICD-10-CM | POA: Diagnosis not present

## 2023-08-16 HISTORY — DX: Schizophrenia, unspecified: F20.9

## 2023-08-16 LAB — COMPREHENSIVE METABOLIC PANEL
ALT: 14 U/L (ref 0–44)
AST: 11 U/L — ABNORMAL LOW (ref 15–41)
Albumin: 4.3 g/dL (ref 3.5–5.0)
Alkaline Phosphatase: 52 U/L (ref 38–126)
Anion gap: 7 (ref 5–15)
BUN: 13 mg/dL (ref 6–20)
CO2: 28 mmol/L (ref 22–32)
Calcium: 9.9 mg/dL (ref 8.9–10.3)
Chloride: 105 mmol/L (ref 98–111)
Creatinine, Ser: 1.21 mg/dL (ref 0.61–1.24)
GFR, Estimated: >60 mL/min (ref >60)
Glucose, Bld: 109 mg/dL — ABNORMAL HIGH (ref 70–99)
Potassium: 4.3 mmol/L (ref 3.5–5.1)
Sodium: 140 mmol/L (ref 135–145)
Total Bilirubin: 0.6 mg/dL (ref 0.0–1.2)
Total Protein: 7.5 g/dL (ref 6.5–8.1)

## 2023-08-16 LAB — ACETAMINOPHEN LEVEL: Acetaminophen (Tylenol), Serum: <10 ug/mL — ABNORMAL LOW (ref 10–30)

## 2023-08-16 LAB — CBC
HCT: 47 % (ref 39.0–52.0)
Hemoglobin: 15.8 g/dL (ref 13.0–17.0)
MCH: 27.1 pg (ref 26.0–34.0)
MCHC: 33.6 g/dL (ref 30.0–36.0)
MCV: 80.6 fL (ref 80.0–100.0)
Platelets: 316 10*3/uL (ref 150–400)
RBC: 5.83 MIL/uL — ABNORMAL HIGH (ref 4.22–5.81)
RDW: 14 % (ref 11.5–15.5)
WBC: 4.2 10*3/uL (ref 4.0–10.5)
nRBC: 0 % (ref 0.0–0.2)

## 2023-08-16 LAB — RAPID URINE DRUG SCREEN, HOSP PERFORMED
Amphetamines: NOT DETECTED
Barbiturates: NOT DETECTED
Benzodiazepines: NOT DETECTED
Cocaine: NOT DETECTED
Opiates: NOT DETECTED
Tetrahydrocannabinol: NOT DETECTED

## 2023-08-16 LAB — SALICYLATE LEVEL: Salicylate Lvl: <7 mg/dL — ABNORMAL LOW (ref 7.0–30.0)

## 2023-08-16 LAB — ETHANOL: Alcohol, Ethyl (B): <10 mg/dL (ref <10)

## 2023-08-16 NOTE — ED Provider Notes (Signed)
 Patient was evaluated by psychiatry and inpatient care was recommended.  Patient has been accepted to [facility] and is planned to be transferred tomorrow morning.  Vitals are stable, no acute complaints.   Rozelle Logan, DO 08/16/23 2326

## 2023-08-16 NOTE — ED Notes (Signed)
 TTS cart place in room in prep for a 2:45PM call. Reviewed process with patient, patient is agreeable and cooperative

## 2023-08-16 NOTE — ED Notes (Signed)
 IP treatment recommended for this pt per Psychiatry MD. Wilkie Aye MD made aware

## 2023-08-16 NOTE — ED Notes (Addendum)
 Reviewed need for urine to complete assessment with patient. Patient verbalized understanding but unable to provide sample at this time. Fluids provided to patient

## 2023-08-16 NOTE — ED Notes (Signed)
TTS in progress at this time.  

## 2023-08-16 NOTE — ED Notes (Signed)
 Microwaved dinner and drink given to pt

## 2023-08-16 NOTE — ED Notes (Signed)
 [facility] AC notified that [facility] patient  waiting for TTS eval. Staff informed that delay is due to volume and would be evaluated timely.

## 2023-08-16 NOTE — BHH Counselor (Signed)
 Disposition Social Work Note:    Date: 08/16/2023 Time: 10:44 PM  Received a call from the intake coordinator at [facility] expressing interest in accepting the patient for admission. However, they requested to speak with the patient's nurse, Ranette, RN, before making a decision. The intake coordinator was transferred to the nurse. I was informed that if they decide to accept the patient, they will call me back with the details of the acceptance. The patient remains under review at this time.

## 2023-08-16 NOTE — BHH Counselor (Addendum)
 Disposition Social Work Note:  Date: 08/16/2023  Dr. Toma Copier, [facility] telepsych provider, recommended inpatient treatment for the patient. Per [facility] AC Edythe Clarity, RN, there are no appropriate beds available at [facility] at this time. The patient has been faxed to the following facilities for consideration of bed placement.  Destination  Service Provider Request Status Services Address Phone Fax Patient Preferred  [facility] Pending - Request Sent -- [facility] [place], [place] [phone] --  [facility] Pending - Request Sent -- [address] [place] [address] [phone] --  [facility] Pending - Request Sent -- [address] [place] [address] [phone] --  [facility] Pending - Request Sent -- [address] [place], [place] [address] [phone] --  [facility] Pending - Request Sent -- [address] [place] [address] [phone] --  [facility] Pending - Request Sent -- [address] [place] [address] [phone] --  [facility] Pending - Request Sent -- [place] [phone] --  [facility] Pending - Request Sent -- [address] [place] [phone] --  [facility] Pending - Request Sent -- [address] [place] [address] [phone] --  [facility] Pending - Request Sent -- [address] [place] [address] [phone] --  [facility] Pending - Request Sent -- [address] [place] [address] [phone] --  [facility] Pending - Request Sent -- [address], [place] [address] [phone] --  [facility] Pending - Request Sent -- [address] [place] [address] [phone] --  [facility] Pending - Request Sent -- [address] [place] [address] [phone] --  [facility] Pending - Request Sent -- [place] [phone] --  [facility] Pending - Request Sent -- [address] [place], [place] [address] [phone] --  [facility] Pending - Request Sent -- [address] [place] [address] [phone] --  [facility] Pending - Request Sent -- [address] [place] [address] [phone] --  [facility] Pending - Request Sent -- [address], [place] [address] [phone] --  [facility] Pending - Request Sent -- [address] [place] [address] [phone] --  [facility] Pending - Request Sent -- [address] [place] [address] [phone] --  [facility] Pending - Request Sent -- [address] [place] [address] [phone] --  [facility] Pending - Request Sent -- [address] [place]le [place] [address] [phone] --  [facility] Pending - Request Sent -- [address], [place] [address] [phone] --  [facility] Pending - Request Sent -- [address] [place], [place] [address] [phone] --  [facility] Pending - Request Sent -- [phone] [place] [address] [phone] --  [facility] Pending - Request Sent -- [address] [place] [address]  [phone] --  [facility] Pending - No Request Sent -- [place] [phone] -- --

## 2023-08-16 NOTE — BHH Counselor (Signed)
 Social Work Disposition Note:    Date: 08/16/2023 Time: 10:51 PM  Received a call from intake coordinator, Ava, at [facility]. The patient has been accepted for admission on 08/16/2023. A bed is available, and the patient may transfer tonight. Upon arrival, the patient will be placed on Unit 800. The accepting provider is Dr. Sherrian Divers. The nurse report number is [phone].  Patient's care team has been provided disposition updates.

## 2023-08-16 NOTE — ED Notes (Signed)
 Pt attempted to provide urine sample. Patient was unable to provide it at this time.

## 2023-08-16 NOTE — ED Provider Notes (Signed)
 [place] EMERGENCY DEPARTMENT AT [facility] Provider Note   CSN: [id number] Arrival date & time: 08/16/23  9562     History  Chief Complaint  Patient presents with   Medical Clearance    David Novak is a 38 y.o. male.  Pt is a 38 yo male with pmhx significant for schizophrenia.  Pt has not been taking his medications.  His family brings him in today due to behavioral issues and odd behavior.  His sister told triage he can't stay with her due to concerns about her children.  He currently lives with his mom.  Family dropped him off and did not stay for me to talk with them.  Pt said he feels ok, but realizes he has "odd behavior," but is not telling me what that is.       Home Medications Prior to Admission medications   Medication Sig Start Date End Date Taking? Authorizing Provider  ARIPiprazole (ABILIFY) 2 MG tablet Take 1 tablet (2 mg total) by mouth daily. 01/05/17   Charm Rings, NP  ARIPiprazole ER 400 MG SRER Inject 400 mg into the muscle once. 01/05/17 01/05/17  Charm Rings, NP  atorvastatin (LIPITOR) 20 MG tablet Take 1 tablet (20 mg total) by mouth at bedtime. Patient not taking: Reported on 05/08/2016 04/27/16   Casey Burkitt, MD  traZODone (DESYREL) 100 MG tablet Take 1 tablet (100 mg total) by mouth at bedtime. 01/05/17   Charm Rings, NP      Allergies    Patient has no known allergies.    Review of Systems   Review of Systems  All other systems reviewed and are negative.   Physical Exam Updated Vital Signs BP (!) 121/96 (BP Location: Left Arm)   Pulse 91   Temp 98 F (36.7 C)   Resp 16   SpO2 96%  Physical Exam Vitals and nursing note reviewed.  Constitutional:      Appearance: Normal appearance.  HENT:     Head: Normocephalic and atraumatic.     Right Ear: External ear normal.     Left Ear: External ear normal.     Mouth/Throat:     Mouth: Mucous membranes are moist.     Pharynx: Oropharynx is clear.  Eyes:      Extraocular Movements: Extraocular movements intact.     Conjunctiva/sclera: Conjunctivae normal.     Pupils: Pupils are equal, round, and reactive to light.  Cardiovascular:     Rate and Rhythm: Normal rate and regular rhythm.     Pulses: Normal pulses.     Heart sounds: Normal heart sounds.  Pulmonary:     Effort: Pulmonary effort is normal.     Breath sounds: Normal breath sounds.  Abdominal:     General: Abdomen is flat. Bowel sounds are normal.     Palpations: Abdomen is soft.  Musculoskeletal:        General: Normal range of motion.     Cervical back: Normal range of motion and neck supple.  Skin:    General: Skin is warm.     Capillary Refill: Capillary refill takes less than 2 seconds.  Neurological:     General: No focal deficit present.     Mental Status: He is alert and oriented to person, place, and time.  Psychiatric:        Mood and Affect: Mood normal.        Behavior: Behavior normal.     ED Results /  Procedures / Treatments   Labs (all labs ordered are listed, but only abnormal results are displayed) Labs Reviewed  COMPREHENSIVE METABOLIC PANEL - Abnormal; Notable for the following components:      Result Value   Glucose, Bld 109 (*)    AST 11 (*)    All other components within normal limits  SALICYLATE LEVEL - Abnormal; Notable for the following components:   Salicylate Lvl <7.0 (*)    All other components within normal limits  ACETAMINOPHEN LEVEL - Abnormal; Notable for the following components:   Acetaminophen (Tylenol), Serum <10 (*)    All other components within normal limits  CBC - Abnormal; Notable for the following components:   RBC 5.83 (*)    All other components within normal limits  ETHANOL  RAPID URINE DRUG SCREEN, HOSP PERFORMED    EKG EKG Interpretation Date/Time:  Monday August 16 2023 10:07:10 EST Ventricular Rate:  88 PR Interval:  144 QRS Duration:  80 QT Interval:  326 QTC Calculation: 394 R Axis:   73  Text  Interpretation: Normal sinus rhythm Normal ECG When compared with ECG of 07-Sep-2017 16:54, QRS axis Shifted left No significant change since last tracing Confirmed by Jacalyn Lefevre [phone]) on 08/16/2023 10:30:33 AM  Radiology No results found.  Procedures Procedures    Medications Ordered in ED Medications - No data to display  ED Course/ Medical Decision Making/ A&P                                 Medical Decision Making Amount and/or Complexity of Data Reviewed Labs: ordered.   This patient presents to the ED for concern of behavior disturbance, this involves an extensive number of treatment options, and is a complaint that carries with it a high risk of complications and morbidity.  The differential diagnosis includes schizophrenia, infection   Co morbidities that complicate the patient evaluation  schizophrenia   Additional history obtained:  Additional history obtained from epic chart review External records from outside source obtained and reviewed including family   Lab Tests:  I Ordered, and personally interpreted labs.  The pertinent results include:  cbc nl, cmp nl, acet and sal level neg, etoh neg, uds neg   Medicines ordered and prescription drug management:   I have reviewed the patients home medicines and have made adjustments as needed  Consultations Obtained:  I requested consultation with TTS,  and discussed lab and imaging findings as well as pertinent plan - consult pending  Problem List / ED Course:  Schizophrenia with medication noncompliance:  pt is medically cleared.  Psych consult ordered.   Reevaluation:  After the interventions noted above, I reevaluated the patient and found that they have :improved   Social Determinants of Health:  Lives with mom   Dispostion:  After consideration of the diagnostic results and the patients response to treatment, I feel that the patent would benefit pending TTS consult..           Final Clinical Impression(s) / ED Diagnoses Final diagnoses:  Schizophrenia, unspecified type [facility])    Rx / DC Orders ED Discharge Orders     None         Jacalyn Lefevre, MD 08/16/23 1318

## 2023-08-16 NOTE — Consult Note (Signed)
 [facility] Consult Note  Patient Name: David Novak MRN: [id number] DOB: November 24, 1985 DATE OF Consult: 08/16/2023  PRIMARY PSYCHIATRIC DIAGNOSES  1.  Schizophrenia  2.   3.    RECOMMENDATIONS  Patient is a 38 yo male w/ PPHx of schizophrenia, prior suicide attempts and psychiatric hospitalizations, noncompliance with medications, who presents to hospital after patient stopped taking his medication. Patient has had odd behaviors lately, laughing inappropriately, responding to internal stimuli, and even took sister's baby and slammed it into the bed. Patient has a hx of jumping out of a 2 story building when noncompliant with medications. He has very poor insight and judgement, refuses to take any medication for symptoms. At this time patient is at acute elevated risk for self harm and harm to others and will require inpatient psychiatric hospitalization for safety and stabilization.     Recommendations: Medication recommendations: Abilify 10 mg Qdaily with plan to switch to LAI; Zyprexa 5 mg PO/IM Q6H PRN agitation , please avoid IM/IV benzos with IM zyprexa  Non-Medication/therapeutic recommendations: EKG to monitor qtc; observation as per hospital protocol  Communication: Treatment team members (and family members if applicable) who were involved in treatment/care discussions and planning, and with whom we spoke or engaged with via secure text/chat, include the following: team   Thank you for involving [place] in the care of this patient. If you have any additional questions or concerns, please call [phone] and ask for me or the provider on-call.  TELEPSYCHIATRY ATTESTATION & CONSENT  As the provider for this telehealth consult, I attest that I verified the patient's identity using two separate identifiers, introduced myself to the patient, provided my credentials, disclosed my location, and performed this encounter via a HIPAA-compliant, real-time, face-to-face, two-way, interactive audio and  video platform and with the full consent and agreement of the patient (or guardian as applicable.)  Patient physical location: [place]  Telehealth provider physical location: home office in state of [place]   Video start time: 310 PM EST  Video end time: 330 PM EST ( with collateral from mother)   IDENTIFYING DATA  [name] is a 38 y.o. year-old male for whom a psychiatric consultation has been ordered by the primary provider. The patient was identified using two separate identifiers.  CHIEF COMPLAINT/REASON FOR CONSULT  " I don't like the medication"   HISTORY OF PRESENT ILLNESS (HPI)  The patient is a 38 yo male w/ PPHx of schizophrenia, prior suicide attempts and psychiatric hospitalizations, noncompliance with medications, who presents to hospital after patient stopped taking his medication and has had bizarre behavior.   Patient is mostly uncooperative, answers with yes/no answers or gives very short responses. He states that he is here because " I have schizophrenia" and because " my family thinks I'm not being my usual self". Patient is not able to really describe any behaviors that his family is perceiving as odd. He reports that he is just being "normal" and taking the dog out for a walk. He denies any issues with sleep/appetite/ mood. Denies any AVH, delusions. He denies any SI/HI/I/P. Does not remember what medication he was previously taking but states that he felt better and so he stopped it. Unable to get any other information from patient .   He gave me permission to speak with his mother who relays that patient has been acting odd lately. He has been responding to internal stimuli, letting the dog pee all over the home and laughing about it. He is also not  taking care of himself, not motivated.  But the aspect she was most concerned about was that he took sister's baby and slammed it into the bed. He then started laughing about it. The baby was not hurt but after that sister  kicked him out and said he is not allowed back for safety reasons. Mother is now living with her other daughter and patient is also not allowed back there. Patient has a hx of suicide attempts when he is not taking his medication - he jumped out of a 2nd story window a few years ago in response to the voices and ended up in the hospital for 8-9 months. Mother has tried to get patient to see a psychiatrist in the community. He did go but told the psychiatrist he wouldn't take any medications. Mother denies that patient has been using any drugs / alcohol.    PAST PSYCHIATRIC HISTORY   Schizophrenia  Prior inpatient psych hospitalizations - most recent last year for suicidal thoughts  Hx of at least one suicide attempt by jumping out of a 2nd story window.  Denies hx of violence toward others besides throwing baby onto the bed  Otherwise as per HPI above.  PAST MEDICAL HISTORY  Past Medical History:  Diagnosis Date   Depression    Schizophrenia (HCC)    Suicide attempt [facility])      HOME MEDICATIONS  PTA Medications  Medication Sig   atorvastatin (LIPITOR) 20 MG tablet Take 1 tablet (20 mg total) by mouth at bedtime. (Patient not taking: Reported on 05/08/2016)   ARIPiprazole (ABILIFY) 2 MG tablet Take 1 tablet (2 mg total) by mouth daily.   ARIPiprazole ER 400 MG SRER Inject 400 mg into the muscle once.   traZODone (DESYREL) 100 MG tablet Take 1 tablet (100 mg total) by mouth at bedtime.     ALLERGIES  No Known Allergies  SOCIAL & SUBSTANCE USE HISTORY  Social History   Socioeconomic History   Marital status: Single    Spouse name: Not on file   Number of children: Not on file   Years of education: Not on file   Highest education level: Not on file  Occupational History   Not on file  Tobacco Use   Smoking status: Former   Smokeless tobacco: Never  Vaping Use   Vaping status: Never Used  Substance and Sexual Activity   Alcohol use: No   Drug use: No   Sexual activity: Yes   Other Topics Concern   Not on file  Social History Narrative   Not on file   Social Drivers of Health   Financial Resource Strain: Not on file  Food Insecurity: Not on file  Transportation Needs: Not on file  Physical Activity: Not on file  Stress: Not on file  Social Connections: Not on file   Social History   Tobacco Use  Smoking Status Former  Smokeless Tobacco Never   Social History   Substance and Sexual Activity  Alcohol Use No   Social History   Substance and Sexual Activity  Drug Use No    Additional pertinent information Mother denies that patient has been using   FAMILY HISTORY  Family History  Problem Relation Age of Onset   Depression Mother    Learning disabilities Brother    Depression Brother    Hyperlipidemia Brother    Family Psychiatric History (if known):  patient's nephew has schizophrenia   MENTAL STATUS EXAM (MSE)  Mental Status Exam: General Appearance:  Casual  Orientation:  Full (Time, Place, and Person)  Memory:   Fair   Concentration:  Fair   Recall:  Poor   Attention  Fair   Eye Contact:  Good   Speech:  very minimal   Language:  Intact   Volume:  Normal  Mood: " Fine"   Affect:  Blunt  Thought Process:  Coherent, but superficial , vague   Thought Content:  impoverished   Suicidal Thoughts:  denies   Homicidal Thoughts:  denies   Judgement:  poor   Insight:  poor   Psychomotor Activity:  Normal    Fund of Knowledge:  Poor              VITALS  Blood pressure (!) 121/96, pulse 91, temperature 98 F (36.7 C), resp. rate 16, SpO2 96%.  LABS  Admission on 08/16/2023  Component Date Value Ref Range Status   Sodium 08/16/2023 140  135 - 145 mmol/L Final   Potassium 08/16/2023 4.3  3.5 - 5.1 mmol/L Final   Chloride 08/16/2023 105  98 - 111 mmol/L Final   CO2 08/16/2023 28  22 - 32 mmol/L Final   Glucose, Bld 08/16/2023 109 (H)  70 - 99 mg/dL Final   Glucose reference range applies only to samples taken after fasting  for at least 8 hours.   BUN 08/16/2023 13  6 - 20 mg/dL Final   Creatinine, Ser 08/16/2023 1.21  0.61 - 1.24 mg/dL Final   Calcium 16/03/9603 9.9  8.9 - 10.3 mg/dL Final   Total Protein 54/02/8118 7.5  6.5 - 8.1 g/dL Final   Albumin 14/78/2956 4.3  3.5 - 5.0 g/dL Final   AST 21/30/8657 11 (L)  15 - 41 U/L Final   ALT 08/16/2023 14  0 - 44 U/L Final   Alkaline Phosphatase 08/16/2023 52  38 - 126 U/L Final   Total Bilirubin 08/16/2023 0.6  0.0 - 1.2 mg/dL Final   GFR, Estimated 08/16/2023 >60  >60 mL/min Final   Comment: (NOTE) Calculated using the CKD-EPI Creatinine Equation (2021)    Anion gap 08/16/2023 7  5 - 15 Final   Performed at [facility], [address], [place], [place] [address]   Alcohol, Ethyl (B) 08/16/2023 <10  <10 mg/dL Final   Comment: (NOTE) Lowest detectable limit for serum alcohol is 10 mg/dL.  For medical purposes only. Performed at [facility], [address], [place], [place] [address]    Salicylate Lvl 08/16/2023 <7.0 (L)  7.0 - 30.0 mg/dL Final   Performed at [facility], [address] [place], [place] [address]   Acetaminophen (Tylenol), Serum 08/16/2023 <10 (L)  10 - 30 ug/mL Final   Comment: (NOTE) Therapeutic concentrations vary significantly. A range of 10-30 ug/mL  may be an effective concentration for many patients. However, some  are best treated at concentrations outside of this range. Acetaminophen concentrations >150 ug/mL at 4 hours after ingestion  and >50 ug/mL at 12 hours after ingestion are often associated with  toxic reactions.  Performed at [facility], [address], [place], [place] [address]    WBC 08/16/2023 4.2  4.0 - 10.5 K/uL Final   RBC 08/16/2023 5.83 (H)  4.22 - 5.81 MIL/uL Final   Hemoglobin 08/16/2023 15.8  13.0 - 17.0 g/dL Final   HCT 72/53/6644 47.0  39.0 - 52.0 % Final   MCV 08/16/2023 80.6  80.0 - 100.0 fL Final   MCH 08/16/2023  27.1  26.0 - 34.0 pg Final   MCHC 08/16/2023 33.6  30.0 - 36.0 g/dL Final   RDW 16/03/9603 14.0  11.5 - 15.5 % Final   Platelets 08/16/2023 316  150 - 400 K/uL Final   nRBC 08/16/2023 0.0  0.0 - 0.2 % Final   Performed at [facility], [address] [place], [place] [address]   Opiates 08/16/2023 NONE DETECTED  NONE DETECTED Final   Cocaine 08/16/2023 NONE DETECTED  NONE DETECTED Final   Benzodiazepines 08/16/2023 NONE DETECTED  NONE DETECTED Final   Amphetamines 08/16/2023 NONE DETECTED  NONE DETECTED Final   Tetrahydrocannabinol 08/16/2023 NONE DETECTED  NONE DETECTED Final   Barbiturates 08/16/2023 NONE DETECTED  NONE DETECTED Final   Comment: (NOTE) DRUG SCREEN FOR MEDICAL PURPOSES ONLY.  IF CONFIRMATION IS NEEDED FOR ANY PURPOSE, NOTIFY LAB WITHIN 5 DAYS.  LOWEST DETECTABLE LIMITS FOR URINE DRUG SCREEN Drug Class                     Cutoff (ng/mL) Amphetamine and metabolites    1000 Barbiturate and metabolites    200 Benzodiazepine                 200 Opiates and metabolites        300 Cocaine and metabolites        300 THC                            50 Performed at [facility], [address], [place], [place] [address]     PSYCHIATRIC REVIEW OF SYSTEMS (ROS)  ROS: Notable for the following relevant positive findings: ROS  Additional findings:      Musculoskeletal: No abnormal movements observed      Gait & Station:  unable to assess          RISK FORMULATION/ASSESSMENT  Is the patient experiencing any suicidal or homicidal ideations: No       Explain if yes:  Protective factors considered for safety management: in hospital   Risk factors/concerns considered for safety management:  poor insight, noncompliant with medications, prior suicide attempt, family hx  Impulsivity Isolation Unwillingness to seek help Male gender Unmarried  Is there a safety management plan with the patient and treatment team to minimize risk  factors and promote protective factors: Yes           Explain: med management  Is crisis care placement or psychiatric hospitalization recommended: Yes     Based on my current evaluation and risk assessment, patient is determined at this time to be at:  High risk  *RISK ASSESSMENT Risk assessment is a dynamic process; it is possible that this patient's condition, and risk level, may change. This should be re-evaluated and managed over time as appropriate. Please re-consult psychiatric consult services if additional assistance is needed in terms of risk assessment and management. If your team decides to discharge this patient, please advise the patient how to best access emergency psychiatric services, or to call 911, if their condition worsens or they feel unsafe in any way.   Toma Copier, MD [facility] Consult Services

## 2023-08-16 NOTE — ED Notes (Signed)
 Patient in restroom, specimen cup provided and reviewed clean cathc urine collection restroom

## 2023-08-16 NOTE — ED Triage Notes (Addendum)
 Pt with hx of schizophrenia is here with his mother due to behavioral issues.  Pt is not taking any medication as he does not like to take it.  Pt has been acting odd.  He is currently staying with his sister and her two small children as well as his mother and due to his behavioral issues the sister states that he may no longer stay in her home as she fears for the safety of her two children.  When asked about his behavior his mother states that he laughed when the dog had an accident in the house, sleeping during the day, being up during the night. Mother states that they worry that his behavior could escalate, per mother no hx of violence. Pt is calm and cooperative in triage, denies any HI, SI

## 2023-08-16 NOTE — ED Notes (Signed)
 Spoke with Ava with [facility] and pt has been accepted at their facility

## 2023-08-16 NOTE — ED Notes (Signed)
 Pt will remain here overnight, no beds available

## 2023-08-17 NOTE — ED Notes (Addendum)
 Pt up to pee introduced myself to pt transport on the way to take pt to [facility]

## 2023-08-17 NOTE — ED Notes (Signed)
 Pt has now been accepted to [facility] for later this am.  Report needs to be called to [phone] press option 2

## 2023-08-17 NOTE — ED Notes (Signed)
 [facility] called to advise they have placement available if needed for this patient

## 2023-08-17 NOTE — ED Notes (Signed)
 Secured chat with all providers that pt will remain here tonight.  Safe transport can no drive >[id number] at night

## 2023-08-17 NOTE — ED Notes (Signed)
 Called David Novak at [facility] to [employer] 07:45

## 2023-08-17 NOTE — ED Notes (Signed)
 Report to [facility]

## 2023-08-17 NOTE — ED Provider Notes (Signed)
 Emergency Medicine Observation Re-evaluation Note  David Novak is a 38 y.o. male, seen on rounds today.  Pt initially presented to the ED for complaints of Medical Clearance Currently, the patient is to be admitted for psych.  Physical Exam  BP 115/89   Pulse 65   Temp 98.6 F (37 C)   Resp 16   SpO2 98%  Physical Exam General: WDWN Cardiac: rrr Lungs: normal sat Psych: compliant  ED Course / MDM  EKG:EKG Interpretation Date/Time:  Monday August 16 2023 10:07:10 EST Ventricular Rate:  88 PR Interval:  144 QRS Duration:  80 QT Interval:  326 QTC Calculation: 394 R Axis:   73  Text Interpretation: Normal sinus rhythm Normal ECG When compared with ECG of 07-Sep-2017 16:54, QRS axis Shifted left No significant change since last tracing Confirmed by Jacalyn Lefevre [phone]) on 08/16/2023 10:30:33 AM  I have reviewed the labs performed to date as well as medications administered while in observation.  Recent changes in the last 24 hours include accepted to facility.  Plan  Current plan is for transport to [facility].    Margarita Grizzle, MD 08/17/23 [phone]
